# Patient Record
Sex: Female | Born: 1965 | Race: White | Hispanic: No | Marital: Married | Smoking: Never smoker
Health system: Southern US, Community
[De-identification: ages and names within clinical notes are randomized; demographics above are authoritative.]

## PROBLEM LIST (undated history)

## (undated) DIAGNOSIS — B019 Varicella without complication: Secondary | ICD-10-CM

## (undated) DIAGNOSIS — T7840XA Allergy, unspecified, initial encounter: Secondary | ICD-10-CM

## (undated) DIAGNOSIS — G43909 Migraine, unspecified, not intractable, without status migrainosus: Secondary | ICD-10-CM

## (undated) HISTORY — DX: Migraine, unspecified, not intractable, without status migrainosus: G43.909

## (undated) HISTORY — DX: Varicella without complication: B01.9

## (undated) HISTORY — DX: Allergy, unspecified, initial encounter: T78.40XA

---
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
## 2007-06-25 HISTORY — PX: BREAST SURGERY: SHX581

## 2007-08-27 LAB — HM PAP SMEAR: HM Pap smear: NORMAL

## 2007-09-23 ENCOUNTER — Ambulatory Visit: Payer: Self-pay | Admitting: Oncology

## 2007-09-25 ENCOUNTER — Ambulatory Visit: Payer: Self-pay | Admitting: General Surgery

## 2007-09-28 ENCOUNTER — Ambulatory Visit: Payer: Self-pay | Admitting: General Surgery

## 2007-09-28 HISTORY — PX: BREAST EXCISIONAL BIOPSY: SUR124

## 2007-10-14 ENCOUNTER — Ambulatory Visit: Payer: Self-pay | Admitting: Oncology

## 2007-10-23 ENCOUNTER — Ambulatory Visit: Payer: Self-pay | Admitting: Oncology

## 2008-03-24 ENCOUNTER — Ambulatory Visit: Payer: Self-pay | Admitting: Oncology

## 2008-04-21 ENCOUNTER — Ambulatory Visit: Payer: Self-pay | Admitting: Oncology

## 2008-04-24 ENCOUNTER — Ambulatory Visit: Payer: Self-pay | Admitting: Oncology

## 2008-09-05 ENCOUNTER — Ambulatory Visit: Payer: Self-pay | Admitting: Obstetrics and Gynecology

## 2009-09-25 ENCOUNTER — Ambulatory Visit: Payer: Self-pay | Admitting: Obstetrics and Gynecology

## 2010-08-29 LAB — HM PAP SMEAR: HM Pap smear: NORMAL

## 2010-10-02 ENCOUNTER — Ambulatory Visit: Payer: Self-pay | Admitting: Obstetrics and Gynecology

## 2011-10-17 ENCOUNTER — Ambulatory Visit: Payer: Self-pay | Admitting: Obstetrics and Gynecology

## 2011-10-17 LAB — HM MAMMOGRAPHY: HM Mammogram: NORMAL

## 2012-05-14 ENCOUNTER — Ambulatory Visit: Payer: Self-pay | Admitting: Family Medicine

## 2012-06-24 HISTORY — PX: BREAST BIOPSY: SHX20

## 2012-08-26 ENCOUNTER — Ambulatory Visit: Payer: Self-pay | Admitting: Internal Medicine

## 2012-08-26 ENCOUNTER — Encounter: Payer: Self-pay | Admitting: Internal Medicine

## 2012-08-26 ENCOUNTER — Ambulatory Visit (INDEPENDENT_AMBULATORY_CARE_PROVIDER_SITE_OTHER): Payer: BC Managed Care – PPO | Admitting: Internal Medicine

## 2012-08-26 VITALS — BP 110/72 | HR 66 | Temp 97.7°F | Resp 16 | Ht 67.5 in | Wt 175.0 lb

## 2012-08-26 DIAGNOSIS — IMO0002 Reserved for concepts with insufficient information to code with codable children: Secondary | ICD-10-CM

## 2012-08-26 DIAGNOSIS — Z789 Other specified health status: Secondary | ICD-10-CM

## 2012-08-26 DIAGNOSIS — M25519 Pain in unspecified shoulder: Secondary | ICD-10-CM | POA: Insufficient documentation

## 2012-08-26 DIAGNOSIS — M25512 Pain in left shoulder: Secondary | ICD-10-CM

## 2012-08-26 DIAGNOSIS — Z79899 Other long term (current) drug therapy: Secondary | ICD-10-CM

## 2012-08-26 DIAGNOSIS — M542 Cervicalgia: Secondary | ICD-10-CM

## 2012-08-26 NOTE — Patient Instructions (Addendum)
We are x raying your neck and shoulder to evaluate alignment and joint for signs of wear and tear  Depending on the results, we will screen for rheumatoid arthritis when yo return for fasting labs   Please sign a medical release form for Dr. Logan Bores' records   Your toe nail looks dystrophic vs early fungal infection  Try filing it daily from the top and applying tee tree oil daily after filing

## 2012-08-26 NOTE — Assessment & Plan Note (Signed)
Bilateral,  With moderate crepitus and grinding on exma.  Plain films.

## 2012-08-26 NOTE — Progress Notes (Signed)
Patient ID: Alice Spencer, female   DOB: [birth year], 47 y.o.   MRN: [id number]    Patient Active Problem List  Diagnosis  . Pain in joint, shoulder region  . Posterior neck pain    Subjective:  CC:   Chief Complaint  Patient presents with  . Establish Care    HPI:   Alice Spencer a 47 y.o. female who presents  Chronic neck pain and left shoulder pain with rotation. Started a few weeks after a History of minor injury last year at the gym when she fell backward.  Shoulder makes a grating nose when she rolls shoulders backward.  No radiation ,  No carpal tunnel syndrome.  Left thumb joint is also  painful. Left knee hurts if she flexes it during squats .  There is a family history of RA in his MGF .  She as history of scoliosis but was never treated since it was mild  (remembers screening in elementary school ). Played volley ball sr year high school.   Worked with a Psychologist, educational.  Has lost 20 lbs at [facility] but not exercising currently bc of daughter's tae kwan do.   Left breast surgery 2009 with sentinel lymph nodes biopsied and negtive. 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 mammogram April 2013 AT [facility].   Annual pelvic exams with Alice Spencer  Last PAP 2009? Mammogram orderdd by him      Past Medical History  Diagnosis Date  . Cancer     Left Breast  . Chicken pox   . Allergy   . Migraines     Past Surgical History  Procedure Laterality Date  . Breast surgery  2009       The following portions of the patient's history were reviewed and updated as appropriate: Allergies, current medications, and problem list.    Review of Systems:   12 Pt  review of systems was negative except those addressed in the HPI,     History   Social History  . Marital Status: Married    Spouse Name: N/A    Number of Children: N/A  . Years of Education: N/A   Occupational History  . Not on file.   Social History Main Topics  . Smoking status: Never Smoker   .  Smokeless tobacco: Never Used  . Alcohol Use: Yes  . Drug Use: No  . Sexually Active: Yes   Other Topics Concern  . Not on file   Social History Narrative             Objective:  BP 110/72  Pulse 66  Temp(Src) 97.7 F (36.5 C) (Oral)  Resp 16  Ht 5' 7.5" (1.715 m)  Wt 175 lb (79.379 kg)  BMI 26.99 kg/m2  SpO2 98%  LMP 08/22/2012  General appearance: alert, cooperative and appears stated age Ears: normal TM's and external ear canals both ears Throat: lips, mucosa, and tongue normal; teeth and gums normal Neck: no adenopathy, no carotid bruit, supple, symmetrical, trachea midline and thyroid not enlarged, symmetric, no tenderness/mass/nodules Back: symmetric, no curvature. ROM normal. No CVA tenderness. Lungs: clear to auscultation bilaterally Heart: regular rate and rhythm, S1, S2 normal, no murmur, click, rub or gallop Abdomen: soft, non-tender; bowel sounds normal; no masses,  no organomegaly Pulses: 2+ and symmetric Skin: Skin color, texture, turgor normal. No rashes or lesions Lymph nodes: Cervical, supraclavicular, and axillary nodes normal.  Assessment and Plan:  Pain in joint, shoulder region Bilateral,  With  moderate crepitus and grinding on exma.  Plain films.   Posterior neck pain Exam consistent with DJD vs OA . Marland Kitchen  No radiculopathy, some scoliosis on exam. Plain films ordered.    Updated Medication List No outpatient encounter prescriptions on file as of 08/26/2012.   No facility-administered encounter medications on file as of 08/26/2012.     Orders Placed This Encounter  Procedures  . DG Cervical Spine Complete  . DG Shoulder Left  . POCT urine pregnancy    No Follow-up on file.

## 2012-08-26 NOTE — Assessment & Plan Note (Signed)
Exam consistent with DJD vs OA . Marland Kitchen  No radiculopathy, some scoliosis on exam. Plain films ordered.

## 2012-08-27 ENCOUNTER — Telehealth: Payer: Self-pay | Admitting: Internal Medicine

## 2012-08-27 DIAGNOSIS — Z13220 Encounter for screening for lipoid disorders: Secondary | ICD-10-CM

## 2012-08-27 DIAGNOSIS — M130 Polyarthritis, unspecified: Secondary | ICD-10-CM

## 2012-08-27 DIAGNOSIS — R5381 Other malaise: Secondary | ICD-10-CM

## 2012-08-27 NOTE — Telephone Encounter (Signed)
Pt notified of results. Scheduled for next week would like Fasting labs and the blood draw for OA completed then if possible. Can you order these?

## 2012-08-27 NOTE — Telephone Encounter (Signed)
X rays of cervical spine and shoulder were normal.  No signs or joint erosions..  Her symptoms therefore are likely plain old OA, not rheumatoid,  But I will add the blood tests to her lab draw just to be sure.

## 2012-08-27 NOTE — Telephone Encounter (Signed)
already done per note

## 2012-09-04 ENCOUNTER — Other Ambulatory Visit (INDEPENDENT_AMBULATORY_CARE_PROVIDER_SITE_OTHER): Payer: BC Managed Care – PPO

## 2012-09-04 DIAGNOSIS — Z13220 Encounter for screening for lipoid disorders: Secondary | ICD-10-CM

## 2012-09-04 DIAGNOSIS — R5381 Other malaise: Secondary | ICD-10-CM

## 2012-09-04 DIAGNOSIS — M130 Polyarthritis, unspecified: Secondary | ICD-10-CM

## 2012-09-04 LAB — CBC WITH DIFFERENTIAL/PLATELET
Basophils Relative: 0.8 % (ref 0.0–3.0)
Eosinophils Absolute: 0.1 10*3/uL (ref 0.0–0.7)
Eosinophils Relative: 1.5 % (ref 0.0–5.0)
Lymphocytes Relative: 25.6 % (ref 12.0–46.0)
Neutrophils Relative %: 66.1 % (ref 43.0–77.0)
RBC: 3.84 Mil/uL — ABNORMAL LOW (ref 3.87–5.11)
WBC: 4.5 10*3/uL (ref 4.5–10.5)

## 2012-09-04 LAB — SEDIMENTATION RATE: Sed Rate: 11 mm/hr (ref 0–22)

## 2012-09-04 LAB — COMPREHENSIVE METABOLIC PANEL
AST: 20 U/L (ref 0–37)
Albumin: 4.1 g/dL (ref 3.5–5.2)
BUN: 18 mg/dL (ref 6–23)
Calcium: 9.1 mg/dL (ref 8.4–10.5)
Chloride: 109 mEq/L (ref 96–112)
Glucose, Bld: 94 mg/dL (ref 70–99)
Potassium: 4.3 mEq/L (ref 3.5–5.1)

## 2012-09-04 LAB — LIPID PANEL
HDL: 51.3 mg/dL (ref >39.00)
LDL Cholesterol: 118 mg/dL — ABNORMAL HIGH (ref 0–99)
Total CHOL/HDL Ratio: 3

## 2012-09-04 LAB — TSH: TSH: 1.8 u[IU]/mL (ref 0.35–5.50)

## 2012-09-07 LAB — ANA: Anti Nuclear Antibody(ANA): NEGATIVE

## 2012-09-28 ENCOUNTER — Ambulatory Visit (INDEPENDENT_AMBULATORY_CARE_PROVIDER_SITE_OTHER): Payer: BC Managed Care – PPO | Admitting: Internal Medicine

## 2012-09-28 ENCOUNTER — Encounter: Payer: Self-pay | Admitting: Internal Medicine

## 2012-09-28 VITALS — BP 108/68 | HR 76 | Temp 98.5°F | Wt 174.8 lb

## 2012-09-28 DIAGNOSIS — Z1239 Encounter for other screening for malignant neoplasm of breast: Secondary | ICD-10-CM

## 2012-09-28 DIAGNOSIS — Z0000 Encounter for general adult medical examination without abnormal findings: Secondary | ICD-10-CM

## 2012-09-28 NOTE — Progress Notes (Signed)
Patient ID: Alice Spencer, female   DOB: [birth year], 47 y.o.   MRN: [id number]   Subjective:     Alice Spencer is a 47 y.o. female here for a routine exam.  Current complaints:  Occasional left knee suprapatellar pain, chronic, evaluated by Ortho last year and PT referral made for s/p tendonitis but patient never went. Skin tag on right inner medial thigh near vagina, on her panty lin so irritating her. . Normal PAPs annually last one 2012. Periods are becoming shorter and heavier. No hot flashes yet.   Personal health questionnaire reviewed: yes.   Gynecologic History Patient's last menstrual period was 09/18/2012. Contraception: none Last Pap: 2012. Results were: normal Last mammogram: 2013. Results were: normal  Obstetric History OB History   Grav Para Term Preterm Abortions TAB SAB Ect Mult Living                   The following portions of the patient's history were reviewed and updated as appropriate: allergies, current medications, past family history, past medical history, past social history, past surgical history and problem list.  Review of Systems A comprehensive review of systems was negative.    Objective:      General Appearance:    Alert, cooperative, no distress, appears stated age  Head:    Normocephalic, without obvious abnormality, atraumatic  Eyes:    PERRL, conjunctiva/corneas clear, EOM's intact, fundi    benign, both eyes  Ears:    Normal TM's and external ear canals, both ears  Nose:   Nares normal, septum midline, mucosa normal, no drainage    or sinus tenderness  Throat:   Lips, mucosa, and tongue normal; teeth and gums normal  Neck:   Supple, symmetrical, trachea midline, no adenopathy;    thyroid:  no enlargement/tenderness/nodules; no carotid   bruit or JVD  Back:     Symmetric, no curvature, ROM normal, no CVA tenderness  Lungs:     Clear to auscultation bilaterally, respirations unlabored  Chest Wall:    No tenderness or deformity   Heart:     Regular rate and rhythm, S1 and S2 normal, no murmur, rub   or gallop  Breast Exam:    No tenderness, masses, or nipple abnormality  Abdomen:     Soft, non-tender, bowel sounds active all four quadrants,    no masses, no organomegaly  Genitalia:    Pelvic: cervix normal in appearance, external genitalia normal, no adnexal masses or tenderness, no cervical motion tenderness, rectovaginal septum normal, uterus normal size, shape, and consistency and vagina normal without discharge  Extremities:   Extremities normal, atraumatic, no cyanosis or edema  Pulses:   2+ and symmetric all extremities  Skin:   Skin color, texture, turgor normal, no rashes or lesions  Lymph nodes:   Cervical, supraclavicular, and axillary nodes normal  Neurologic:   CNII-XII intact, normal strength, sensation and reflexes    throughout       Assessment:    Healthy female exam.    Plan:    Follow up in: 1 year.

## 2012-09-30 ENCOUNTER — Encounter: Payer: Self-pay | Admitting: Internal Medicine

## 2012-10-01 ENCOUNTER — Encounter: Payer: Self-pay | Admitting: Internal Medicine

## 2012-10-28 ENCOUNTER — Ambulatory Visit: Payer: Self-pay | Admitting: Internal Medicine

## 2012-10-30 ENCOUNTER — Telehealth: Payer: Self-pay | Admitting: Internal Medicine

## 2012-10-30 ENCOUNTER — Ambulatory Visit: Payer: Self-pay | Admitting: Internal Medicine

## 2012-10-30 NOTE — Telephone Encounter (Signed)
radiology needs additional views of the left breast on recent mammogram.  . They should be calling her

## 2012-11-03 ENCOUNTER — Telehealth: Payer: Self-pay | Admitting: Internal Medicine

## 2012-11-03 NOTE — Telephone Encounter (Signed)
I have spoken with patient by phone, and recommended Dr. Lemar Livings.  I told patient amber would call her with the date time and address.

## 2012-11-03 NOTE — Telephone Encounter (Signed)
Alice Spencer @ [facility] called stating the patients additional views showed Bi Rads 4. I have scheduled her with [facility] on 5/27 @ 945. The patient is not aware at this point, I was waiting for someone to contact her.

## 2012-11-03 NOTE — Telephone Encounter (Signed)
Please advise 

## 2012-11-05 ENCOUNTER — Telehealth: Payer: Self-pay | Admitting: *Deleted

## 2012-11-05 NOTE — Telephone Encounter (Signed)
Talked with patient came by at lunch today and picked up appointment.

## 2012-11-05 NOTE — Telephone Encounter (Signed)
Patient called stating had extra mammogram images done on 5/9 would like to know the next step. Called patient left message to return call to assure her that as soon as results are available we will call.

## 2012-11-17 ENCOUNTER — Ambulatory Visit: Payer: Self-pay | Admitting: General Surgery

## 2012-11-23 ENCOUNTER — Ambulatory Visit: Payer: Self-pay | Admitting: General Surgery

## 2012-11-25 ENCOUNTER — Ambulatory Visit (INDEPENDENT_AMBULATORY_CARE_PROVIDER_SITE_OTHER): Payer: BC Managed Care – PPO | Admitting: General Surgery

## 2012-11-25 ENCOUNTER — Encounter: Payer: Self-pay | Admitting: General Surgery

## 2012-11-25 VITALS — BP 138/82 | HR 76 | Resp 12 | Ht 67.0 in | Wt 183.0 lb

## 2012-11-25 DIAGNOSIS — R920 Mammographic microcalcification found on diagnostic imaging of breast: Secondary | ICD-10-CM

## 2012-11-25 NOTE — Progress Notes (Signed)
Patient ID: Alice Spencer, female   DOB: 1966-02-10, 47 y.o.   MRN: [id number]  Chief Complaint  Patient presents with  . Breast Problem    HPI Alice Spencer is a 47 y.o. female. Patient here today referred by D.r Darrick Huntsman for breast evaluation. She had an abnormal mammogram on 10-28-12. She has "not felt anything". Denies breast trauma or injury.  Denies family history of breast cancer. Known history of benign phylloid tumor left breast  SN negative and margins were clear in April 2009 by Dr Okey Dupre. HPI  Past Medical History  Diagnosis Date  . Chicken pox   . Allergy   . Migraines     Past Surgical History  Procedure Laterality Date  . Breast surgery  2009    benign phylloid tumor    Family History  Problem Relation Age of Onset  . Arthritis Father   . Diabetes Father   . Cancer Maternal Uncle     colon  . Cancer Paternal Aunt     lung  . Arthritis Maternal Grandfather   . Cancer Maternal Grandfather     colon  . Cancer Paternal Grandfather     carcinoma of the skin  . Cancer Maternal Uncle     colon    Social History History  Substance Use Topics  . Smoking status: Never Smoker   . Smokeless tobacco: Never Used  . Alcohol Use: Yes     Comment: occasionally    Allergies  Allergen Reactions  . Penicillins Rash    Current Outpatient Prescriptions  Medication Sig Dispense Refill  . ibuprofen (ADVIL,MOTRIN) 600 MG tablet Take 1 tablet by mouth as needed.       No current facility-administered medications for this visit.    Review of Systems Review of Systems  Constitutional: Negative.   Respiratory: Negative.   Cardiovascular: Negative.     Blood pressure 138/82, pulse 76, resp. rate 12, height 5\' 7"  (1.702 m), weight 183 lb (83.008 kg), last menstrual period 11/12/2012.  Physical Exam Physical Exam  Constitutional: She is oriented to person, place, and time. She appears well-developed and well-nourished.  Cardiovascular: Normal rate and regular rhythm.    Pulmonary/Chest: Effort normal and breath sounds normal. Right breast exhibits no inverted nipple, no mass, no nipple discharge, no skin change and no tenderness. Left breast exhibits no inverted nipple, no mass, no nipple discharge, no skin change and no tenderness.  Lymphadenopathy:    She has no cervical adenopathy.    She has no axillary adenopathy.  Neurological: She is alert and oriented to person, place, and time.  Skin: Skin is warm and dry.    Data Reviewed Pathology from the 09/28/2007 left breast excision and sentinel node biopsy showed a benign phylloid ease tumor measuring 1.6 cm in diameter. Atypical epithelial hyperplasia was limited to the phyllodes tumor itself. Benign lymph nodes were identified. The overlying skin was unremarkable.  Screening mammograms dated 10/29/2012 showed heterogeneously dense breast tissue. A small focal asymmetry in the superior lateral left breast was unchanged from studies dating back to 2011. A new cluster of microcalcifications were identified in the anterior depth. The right breast is unremarkable. BI-RAD-0.  Focal spot compression views of the left breast dated 10/30/2012 showed a small cluster of calcifications in the inferior superior lateral left breast. Many around a few are curvilinear and a few amorphous. Additional round calcifications are noted in the retroareolar area as well. BI-RAD-4  Assessment    New microcalcifications of  the left breast.    Plan    Options for management were reviewed: 1) careful six-month followup exam for progression versus 2) early stereotactic biopsy.  The pros and cons of each were reviewed. 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 There was no risk of upstaging disease or missing an opportunity for breast conservation with a followup mammogram. The patient is somewhat concerned about change in her mammogram over the last year and desired  to proceed biopsy. The risks and benefits of biopsy were reviewed in detail. Bleeding and infection, post procedure pain and discomfort during the procedure were discussed.  The procedure is tentatively scheduled for 12/07/2012.       Earline Mayotte 11/26/2012, 2:34 PM

## 2012-11-25 NOTE — Patient Instructions (Addendum)
Monitor and repeat mammogram in 6 months or proceed with biopsy.  The stereotactic procedure was reviewed with the patient. The potential for bleeding, infection and pain was reviewed. At this time, the benefits outweigh the risk, and the patient is amenable to proceed.  Patient has been scheduled for left stereotactic breast biopsy on 12/07/2012 at 1:00 at [facility].  Patient has been informed of instructions.

## 2012-11-26 ENCOUNTER — Encounter: Payer: Self-pay | Admitting: General Surgery

## 2012-11-27 ENCOUNTER — Encounter: Payer: Self-pay | Admitting: Internal Medicine

## 2012-12-07 ENCOUNTER — Ambulatory Visit: Payer: Self-pay | Admitting: General Surgery

## 2012-12-07 ENCOUNTER — Encounter: Payer: Self-pay | Admitting: Internal Medicine

## 2012-12-07 DIAGNOSIS — R920 Mammographic microcalcification found on diagnostic imaging of breast: Secondary | ICD-10-CM

## 2012-12-08 ENCOUNTER — Telehealth: Payer: Self-pay | Admitting: *Deleted

## 2012-12-08 ENCOUNTER — Telehealth: Payer: Self-pay | Admitting: General Surgery

## 2012-12-08 LAB — PATHOLOGY REPORT

## 2012-12-08 NOTE — Telephone Encounter (Signed)
Patient called stating you were going to call her this afternoon with biopsy results. This patient has asked that you call her cell number, [phone].

## 2012-12-08 NOTE — Telephone Encounter (Signed)
Patient notified that the pathology on yesterday's left breast biopsy was benign. Reports she is doing well. She will follow up with the nurse next week for a wound check as scheduled, and will have a left mammogram arranged through this office in six months.

## 2012-12-10 ENCOUNTER — Encounter: Payer: Self-pay | Admitting: General Surgery

## 2012-12-15 ENCOUNTER — Ambulatory Visit (INDEPENDENT_AMBULATORY_CARE_PROVIDER_SITE_OTHER): Payer: BC Managed Care – PPO | Admitting: *Deleted

## 2012-12-15 ENCOUNTER — Ambulatory Visit: Payer: Self-pay | Admitting: General Surgery

## 2012-12-15 DIAGNOSIS — R920 Mammographic microcalcification found on diagnostic imaging of breast: Secondary | ICD-10-CM

## 2012-12-15 NOTE — Progress Notes (Signed)
Patient ID: Alice Spencer, female   DOB: 1965/08/15, 47 y.o.   MRN: [id number] Patient here today for follow up post breast left biopsy. .  Minimal bruising noted.  The patient is aware that a heating pad may be used for comfort as needed.  Aware of pathology. Follow up as scheduled.

## 2013-04-30 ENCOUNTER — Encounter: Payer: Self-pay | Admitting: General Surgery

## 2013-04-30 ENCOUNTER — Ambulatory Visit: Payer: Self-pay | Admitting: General Surgery

## 2013-05-10 ENCOUNTER — Encounter: Payer: Self-pay | Admitting: General Surgery

## 2013-05-10 ENCOUNTER — Ambulatory Visit (INDEPENDENT_AMBULATORY_CARE_PROVIDER_SITE_OTHER): Payer: BC Managed Care – PPO | Admitting: General Surgery

## 2013-05-10 VITALS — BP 132/68 | HR 84 | Resp 14 | Ht 68.0 in | Wt 198.0 lb

## 2013-05-10 DIAGNOSIS — N6002 Solitary cyst of left breast: Secondary | ICD-10-CM

## 2013-05-10 DIAGNOSIS — N6009 Solitary cyst of unspecified breast: Secondary | ICD-10-CM

## 2013-05-10 DIAGNOSIS — R920 Mammographic microcalcification found on diagnostic imaging of breast: Secondary | ICD-10-CM

## 2013-05-10 NOTE — Patient Instructions (Signed)
Patient to return as needed. 

## 2013-05-10 NOTE — Progress Notes (Signed)
Patient ID: Alice Spencer, female   DOB: [birth year], 47 y.o.   MRN: [id number]  Chief Complaint  Patient presents with  . Follow-up    mammogram    HPI Alice Spencer is a 47 y.o. female who presents for a breast evaluation. The most recent mammogram was done on 04/30/13. Patient does perform regular self breast checks and gets regular mammograms done.   The patient reports no difficulties with her breast.  HPI  Past Medical History  Diagnosis Date  . Chicken pox   . Allergy   . Migraines     Past Surgical History  Procedure Laterality Date  . Breast surgery  2009    benign phylloid tumor    Family History  Problem Relation Age of Onset  . Arthritis Father   . Diabetes Father   . Cancer Maternal Uncle     colon  . Cancer Paternal Aunt     lung  . Arthritis Maternal Grandfather   . Cancer Maternal Grandfather     colon  . Cancer Paternal Grandfather     carcinoma of the skin  . Cancer Maternal Uncle     colon    Social History History  Substance Use Topics  . Smoking status: Never Smoker   . Smokeless tobacco: Never Used  . Alcohol Use: Yes     Comment: occasionally    Allergies  Allergen Reactions  . Penicillins Rash    Current Outpatient Prescriptions  Medication Sig Dispense Refill  . ibuprofen (ADVIL,MOTRIN) 600 MG tablet Take 1 tablet by mouth as needed.       No current facility-administered medications for this visit.    Review of Systems Review of Systems  Constitutional: Negative.   Respiratory: Negative.     Blood pressure 132/68, pulse 84, resp. rate 14, height 5\' 8"  (1.727 m), weight 198 lb (89.812 kg), last menstrual period 04/19/2013.  Physical Exam Physical Exam  Constitutional: She is oriented to person, place, and time. She appears well-developed and well-nourished.  Eyes: No scleral icterus.  Cardiovascular: Normal rate, regular rhythm and normal heart sounds.   Pulmonary/Chest: Breath sounds normal. Right breast exhibits no  inverted nipple, no mass, no nipple discharge, no skin change and no tenderness. Left breast exhibits no inverted nipple, no mass, no nipple discharge, no skin change and no tenderness.  Abdominal: Soft. Bowel sounds are normal.  Lymphadenopathy:    She has no cervical adenopathy.    She has no axillary adenopathy.  Neurological: She is alert and oriented to person, place, and time.  Skin: Skin is warm and dry.    Data Reviewed Left breast mammogram dated April 30, 2013 was reviewed. Postbiopsy changes.BI-RAD-1.    Assessment    Benign breast exam.     Plan    The patient should resume annual screening mammograms in spring 2015. The patient was encouraged to call the office if she experiences any changes in her breasts. Follow up otherwise will be on an as-needed basis.        Alice Spencer 05/10/2013, 9:44 PM

## 2014-02-22 ENCOUNTER — Encounter: Payer: Self-pay | Admitting: Podiatry

## 2014-02-22 ENCOUNTER — Ambulatory Visit (INDEPENDENT_AMBULATORY_CARE_PROVIDER_SITE_OTHER): Payer: BC Managed Care – PPO

## 2014-02-22 ENCOUNTER — Ambulatory Visit (INDEPENDENT_AMBULATORY_CARE_PROVIDER_SITE_OTHER): Payer: BC Managed Care – PPO | Admitting: Podiatry

## 2014-02-22 VITALS — BP 95/63 | HR 70 | Resp 16 | Ht 68.0 in | Wt 153.0 lb

## 2014-02-22 DIAGNOSIS — M8430XA Stress fracture, unspecified site, initial encounter for fracture: Secondary | ICD-10-CM

## 2014-02-22 DIAGNOSIS — M84371A Stress fracture, right ankle, initial encounter for fracture: Secondary | ICD-10-CM

## 2014-02-22 DIAGNOSIS — M779 Enthesopathy, unspecified: Secondary | ICD-10-CM

## 2014-02-23 NOTE — Progress Notes (Signed)
Subjective:     Patient ID: Alice Spencer, female   DOB: 29-Apr-1966, 48 y.o.   MRN: [id number]  HPI 48 year old female presents the office today with complaints of right medial ankle pain. She states that on January 30, 2014 while at church camp she had a table will over her ankle resulting in an injury. After the injury she notes her ankle became severely bruised on both the medial and lateral aspects and pain with ambulation. States that she went to the office in which x-rays were obtained and she was told she had no fracture. Since the injury she continues to have pain with some bruising and swelling to the ankle. She states she had an abrasion to the medial ankle at the time of the accident which is currently scabbed over. Discomfort with ambulation and pressure. No other complaints. Review of Systems  Musculoskeletal:       Right medial ankle pain  All other systems reviewed and are negative.      Objective:   Physical Exam AAO x3, NAD DP/PT pulses palpable 2/4 b/l. CRT < 3sec Protective sensation intact with Derrel Nip monofilament, vibratory sensation intact. Pain on palpation to the right medial malleolus as well as pain with vibration over the area. She has mild discomfort over the flexor tendons. Mild discomfort with flexion-extension of the hallux along the medial ankle. Mild discomfort with plantarflexion, inversion although MMT 5/5. ROM WNL. No pain along the lateral ankle ligaments or fibula, syndesmosis, proximal tib-fib, foot. No pain with lateral compression of the calcaneus.No pain/deformity to the left lower extremity.  Ecchymosis over the lateral aspect of the heel and medial ankle.  Healed abrasion over the medial aspect of the ankle without any surrounding erythema, drainage, or other clinical signs of infection.     Assessment:     48 year old female possible stress fracture medial malleolus, flexor tendinitis.    Plan:     -X-rays were obtained and reviewed with  the patient. -Various treatment options were discussed with patient including alternatives, risks, complications. -Due to the pain directly over the medial malleolus and pain with vibration, will treat as a stress fracture for now with immobilization in CAM boot (informed her she cannot drive with the boot). Will re x-ray in 2 weeks to determine if there are any osseous changes.  -Pain along the course of the flexor tendons could be a result of tendonitis due to the injury. The tendons do appear to be intact at this time. -Follow up in 2 weeks, or sooner if any problems arise or if there are any change in symptoms.  -At 2 weeks, if there continues to be pain/swelling and negative x-ray, will likely obtain an MRI.  -Call with any questions or concerns, change in symptoms.

## 2014-03-08 ENCOUNTER — Ambulatory Visit (INDEPENDENT_AMBULATORY_CARE_PROVIDER_SITE_OTHER): Payer: BC Managed Care – PPO

## 2014-03-08 ENCOUNTER — Ambulatory Visit (INDEPENDENT_AMBULATORY_CARE_PROVIDER_SITE_OTHER): Payer: BC Managed Care – PPO | Admitting: Podiatry

## 2014-03-08 VITALS — BP 115/65 | HR 63 | Resp 16

## 2014-03-08 DIAGNOSIS — T148XXA Other injury of unspecified body region, initial encounter: Secondary | ICD-10-CM

## 2014-03-08 DIAGNOSIS — M779 Enthesopathy, unspecified: Secondary | ICD-10-CM

## 2014-03-08 NOTE — Progress Notes (Signed)
Patient ID: AUDREENA SACHDEVA, female   DOB: Feb 15, 1966, 48 y.o.   MRN: [id number]  Subjective: Ms. O'Neil returns the office they for followup evaluation of right medial ankle pain. She states that she was wearing the CAM boot for some time after her last appointment but she since has discontinued. She states that her pain has decreased as well as the swelling/bruising. She continues have slight numbness over the medial ankle over the site where the table fell on her. Denies any pain with ambulation.  She presents today wearing a sandal. No other complaints.  Objective: AAO x3, NAD DP/PT pulses palpable 2/4 b/l. CRT< 3sec Protective sensation intact with Derrel Nip monofilament, vibratory sensation intact, Achilles tendon reflex intact. Mild numbness over medial ankle over the site of injury. Sensation intact over the medial aspect of the foot. Mild discomfort over the medial aspect of the ankle overlying the medial malleolus. Minimal overlying edema and has decreased his last appointment. Ecchymosis over the ankle/heel has resolved. Small healed abrasion over the medial aspect of the ankle but no surrounding erythema, drainage or other clinical signs of infection. Mild discomfort upon plantar flexion of the hallux along the medial ankle. No pain with ankle active or passive range of motion. MMT 5/5, ROM WNL No leg pain, swelling, warmth.  Assessment: 48 year old female left medial malleolus or contusion, flexor tendinitis.  Plan: -Various treatment options were discussed including alternatives, risks, complications. -X-rays were obtained and reviewed with the patient. No evidence of fracture or stress fracture. -Recommend continuing ice to the effected area. -She is currently not having any difficulty with ambulation can continue to wear a regular shoe. If she needs some stability recommended an ankle brace or return to the boot if needed. -Anti-inflammatories as needed. -Continue with  compression anklet to help decrease edema. -Followup as needed. Call with any questions or concerns or change in symptoms.

## 2014-03-23 ENCOUNTER — Ambulatory Visit: Payer: BC Managed Care – PPO | Admitting: Internal Medicine

## 2014-04-25 ENCOUNTER — Encounter: Payer: Self-pay | Admitting: Podiatry

## 2014-04-26 ENCOUNTER — Ambulatory Visit (INDEPENDENT_AMBULATORY_CARE_PROVIDER_SITE_OTHER): Payer: BC Managed Care – PPO | Admitting: Internal Medicine

## 2014-04-26 ENCOUNTER — Other Ambulatory Visit (HOSPITAL_COMMUNITY)
Admission: RE | Admit: 2014-04-26 | Discharge: 2014-04-26 | Disposition: A | Discharge status: Home / Self Care | Payer: BC Managed Care – PPO | Source: Ambulatory Visit | Attending: Internal Medicine | Admitting: Internal Medicine

## 2014-04-26 ENCOUNTER — Encounter: Payer: Self-pay | Admitting: Internal Medicine

## 2014-04-26 VITALS — BP 112/72 | HR 78 | Temp 98.6°F | Resp 16 | Ht 68.0 in | Wt 170.5 lb

## 2014-04-26 DIAGNOSIS — Z0000 Encounter for general adult medical examination without abnormal findings: Secondary | ICD-10-CM

## 2014-04-26 DIAGNOSIS — Z1151 Encounter for screening for human papillomavirus (HPV): Secondary | ICD-10-CM | POA: Diagnosis present

## 2014-04-26 DIAGNOSIS — Z23 Encounter for immunization: Secondary | ICD-10-CM

## 2014-04-26 DIAGNOSIS — Z1239 Encounter for other screening for malignant neoplasm of breast: Secondary | ICD-10-CM

## 2014-04-26 DIAGNOSIS — Z124 Encounter for screening for malignant neoplasm of cervix: Secondary | ICD-10-CM

## 2014-04-26 DIAGNOSIS — R634 Abnormal weight loss: Secondary | ICD-10-CM

## 2014-04-26 DIAGNOSIS — Z01419 Encounter for gynecological examination (general) (routine) without abnormal findings: Secondary | ICD-10-CM | POA: Diagnosis not present

## 2014-04-26 DIAGNOSIS — E559 Vitamin D deficiency, unspecified: Secondary | ICD-10-CM

## 2014-04-26 DIAGNOSIS — R920 Mammographic microcalcification found on diagnostic imaging of breast: Secondary | ICD-10-CM

## 2014-04-26 LAB — CBC WITH DIFFERENTIAL/PLATELET
BASOS PCT: 0.8 % (ref 0.0–3.0)
Basophils Absolute: 0.1 10*3/uL (ref 0.0–0.1)
Eosinophils Absolute: 0.1 10*3/uL (ref 0.0–0.7)
Eosinophils Relative: 2.1 % (ref 0.0–5.0)
HEMATOCRIT: 39.8 % (ref 36.0–46.0)
HEMOGLOBIN: 13 g/dL (ref 12.0–15.0)
LYMPHS PCT: 26 % (ref 12.0–46.0)
Lymphs Abs: 1.8 10*3/uL (ref 0.7–4.0)
MCHC: 32.7 g/dL (ref 30.0–36.0)
MCV: 94.1 fl (ref 78.0–100.0)
MONOS PCT: 4.5 % (ref 3.0–12.0)
Monocytes Absolute: 0.3 10*3/uL (ref 0.1–1.0)
NEUTROS ABS: 4.6 10*3/uL (ref 1.4–7.7)
Neutrophils Relative %: 66.6 % (ref 43.0–77.0)
Platelets: 303 10*3/uL (ref 150.0–400.0)
RBC: 4.23 Mil/uL (ref 3.87–5.11)
RDW: 12.1 % (ref 11.5–15.5)
WBC: 6.9 10*3/uL (ref 4.0–10.5)

## 2014-04-26 LAB — COMPREHENSIVE METABOLIC PANEL
ALBUMIN: 3.8 g/dL (ref 3.5–5.2)
ALK PHOS: 73 U/L (ref 39–117)
ALT: 21 U/L (ref 0–35)
AST: 22 U/L (ref 0–37)
BUN: 18 mg/dL (ref 6–23)
CO2: 33 mEq/L — ABNORMAL HIGH (ref 19–32)
Calcium: 9.4 mg/dL (ref 8.4–10.5)
Chloride: 99 mEq/L (ref 96–112)
Creatinine, Ser: 1.2 mg/dL (ref 0.4–1.2)
GFR: 51.84 mL/min — ABNORMAL LOW (ref >60.00)
Glucose, Bld: 93 mg/dL (ref 70–99)
POTASSIUM: 3.9 meq/L (ref 3.5–5.1)
SODIUM: 137 meq/L (ref 135–145)
TOTAL PROTEIN: 7.2 g/dL (ref 6.0–8.3)
Total Bilirubin: 0.9 mg/dL (ref 0.2–1.2)

## 2014-04-26 NOTE — Progress Notes (Signed)
Patient ID: Alice Spencer, female   DOB: [birth year], 48 y.o.   MRN: [id number]     Subjective:     Alice Spencer is a 48 y.o. female and is here for a comprehensive physical exam. The patient reports no problems.  She has intentionally lost weight .  Started at 193 lbs in January, nadir was 155 lbs a few weeks ago.   Using herbal life and working out with a trainer 3/week; still drinking one shake daily.    History   Social History  . Marital Status: Married    Spouse Name: N/A    Number of Children: N/A  . Years of Education: N/A   Occupational History  . Not on file.   Social History Main Topics  . Smoking status: Never Smoker   . Smokeless tobacco: Never Used  . Alcohol Use: 1.2 oz/week    1 Glasses of wine, 1 Shots of liquor per week     Comment: occasionally  . Drug Use: No  . Sexual Activity: Yes   Other Topics Concern  . Not on file   Social History Narrative            Health Maintenance  Topic Date Due  . PAP SMEAR  09/04/2013  . INFLUENZA VACCINE  01/23/2015  . TETANUS/TDAP  04/26/2024    The following portions of the patient's history were reviewed and updated as appropriate: allergies, current medications, past family history, past medical history, past social history, past surgical history and problem list.  Review of Systems A comprehensive review of systems was negative.   Objective:   BP 112/72 mmHg  Pulse 78  Temp(Src) 98.6 F (37 C) (Oral)  Resp 16  Ht 5\' 8"  (1.727 m)  Wt 170 lb 8 oz (77.338 kg)  BMI 25.93 kg/m2  SpO2 100%  LMP 04/16/2014 (Exact Date)  General Appearance:    Alert, cooperative, no distress, appears stated age  Head:    Normocephalic, without obvious abnormality, atraumatic  Eyes:    PERRL, conjunctiva/corneas clear, EOM's intact, fundi    benign, both eyes  Ears:    Normal TM's and external ear canals, both ears  Nose:   Nares normal, septum midline, mucosa normal, no drainage    or sinus tenderness  Throat:   Lips,  mucosa, and tongue normal; teeth and gums normal  Neck:   Supple, symmetrical, trachea midline, no adenopathy;    thyroid:  no enlargement/tenderness/nodules; no carotid   bruit or JVD  Back:     Symmetric, no curvature, ROM normal, no CVA tenderness  Lungs:     Clear to auscultation bilaterally, respirations unlabored  Chest Wall:    No tenderness or deformity   Heart:    Regular rate and rhythm, S1 and S2 normal, no murmur, rub   or gallop  Breast Exam:    No tenderness, masses, or nipple abnormality  Abdomen:     Soft, non-tender, bowel sounds active all four quadrants,    no masses, no organomegaly  Genitalia:    Pelvic: cervix normal in appearance, external genitalia normal, no adnexal masses or tenderness, no cervical motion tenderness, rectovaginal septum normal, uterus normal size, shape, and consistency and vagina normal without discharge  Extremities:   Extremities normal, atraumatic, no cyanosis or edema  Pulses:   2+ and symmetric all extremities  Skin:   Skin color, texture, turgor normal, no rashes or lesions  Lymph nodes:   Cervical, supraclavicular, and axillary nodes normal  Neurologic:   CNII-XII intact, normal strength, sensation and reflexes    throughout     Assessment and Plan:   Breast microcalcification, mammographic S/p breast biopsy in Sept 2014,  Benign.  Resume annual mammograms .  Breast exam today was normal.   Routine general medical examination at a health care facility Annualwellness  exam was done as well as a comprehensive physical exam and management of acute and chronic conditions .  During the course of the visit the patient was educated and counseled about appropriate screening and preventive services including : fall prevention , diabetes screening, nutrition counseling, colorectal cancer screening, and recommended immunizations.  Printed recommendations for health maintenance screenings was given.   Loss of weight Intentional,  from an obese BMI  at 198 lbs , down  To 153 lbs initially. with some regain of weight  since altering her diet. I have congratulated her in reduction of   BMI and encouraged  Continued weight management usin g  a low glycemic index diet and regular exercise a minimum of 5 days per week.     Updated Medication List Outpatient Encounter Prescriptions as of 04/26/2014  Medication Sig  . ibuprofen (ADVIL,MOTRIN) 600 MG tablet Take 1 tablet by mouth as needed.

## 2014-04-26 NOTE — Progress Notes (Signed)
Pre-visit discussion using our clinic review tool. No additional management support is needed unless otherwise documented below in the visit note.  

## 2014-04-26 NOTE — Assessment & Plan Note (Addendum)
S/p breast biopsy in Sept 2014,  Benign.  Resume annual mammograms .  Breast exam today was normal.

## 2014-04-26 NOTE — Patient Instructions (Addendum)
You had your annual  wellness exam today.  We will repeat your PAP smear in 2018, sooner if needed   We will schedule your mammogram at [facility] in December at [place] received the TDaP vaccine today.  If you develop a sore arm , use tylenol and ibuprofen for a few days   We will contact you with the bloodwork results  This is  my version of a  "Low GI"  Diet:  It will help you maintain your weight  All of the foods can be found at grocery stores and in bulk at [employer].  The Atkins protein bars and shakes are available in more varieties at [employer], [employer] and [place].     7 AM Breakfast:  Choose from the following:  Low carbohydrate Protein  Shakes (I recommend the EAS AdvantEdge "Carb Control" shakes  Or the low carb shakes by Atkins.    2.5 carbs   Arnold's "Sandwhich Thin"toasted  w/ peanut butter (no jelly: about 20 net carbs  "Bagel Thin" with cream cheese and salmon: about 20 carbs   a scrambled egg/bacon/cheese burrito made with [place]'s "carb balance" whole wheat tortilla  (about 10 net carbs )   Avoid cereal and bananas, oatmeal and cream of wheat and grits. They are loaded with carbohydrates!   10 AM: high protein snack  Protein bar by Atkins (the snack size, under 200 cal, usually < 6 net carbs).    A stick of cheese:  Around 1 carb,  100 cal     Dannon Light n Fit [place] Yogurt  (80 cal, 8 carbs)  Other so called "protein bars" and Greek yogurts tend to be loaded with carbohydrates.  Remember, in food advertising, the word "energy" is synonymous for " carbohydrate."  Lunch:   A Sandwich using the bread choices listed, Can use any  Eggs,  lunchmeat, grilled meat or canned tuna), avocado, regular mayo/mustard  and cheese.  A Salad using blue cheese, ranch,  Goddess or vinagrette,  No croutons or "confetti" and no "candied nuts" but regular nuts OK.   No pretzels or chips.  Pickles and miniature sweet peppers are a good low carb alternative  that provide a "crunch"  The bread is the only source of carbohydrate in a sandwich and  can be decreased by trying some of these alternatives to traditional loaf bread  Joseph's makes a pita bread and a flat bread that are 50 cal and 4 net carbs available at [place] and [place].  This can be toasted to use with hummous as well  Toufayan makes a low carb flatbread that's 100 cal and 9 net carbs available at [employer] and [employer]'s makes 2 sizes of  Low carb whole wheat tortilla  (The large one is 210 cal and 6 net carbs) Avoid "Low fat dressings, as well as Barry Brunner and [place] dressings They are loaded with sugar!   3 PM/ Mid day  Snack:  Consider  1 ounce of  almonds, walnuts, pistachios, pecans, peanuts,  Macadamia nuts or a nut medley.  Avoid "granola"; the dried cranberries and raisins are loaded with carbohydrates. Mixed nuts as long as there are no raisins,  cranberries or dried fruit.    Try the prosciutto/mozzarella cheese sticks by Fiorruci  In deli /backery section   High protein      6 PM  Dinner:     Meat/fowl/fish with a green salad,  and either broccoli, cauliflower, green beans, spinach, brussel sprouts or  Lima beans. DO NOT BREAD THE PROTEIN!!      There is a low carb pasta by Dreamfield's that is acceptable and tastes great: only 5 digestible carbs/serving.( All grocery stores but BJs carry it )  Try Hurley Cisco Angelo's chicken piccata or chicken or eggplant parm over low carb pasta.(Lowes and BJs)   Marjory Lies Sanchez's "Carnitas" (pulled pork, no sauce,  0 carbs) or his beef pot roast to make a dinner burrito (at [employer]J's)  Pesto over low carb pasta (bj's sells a good quality pesto in the center refrigerated section of the deli   Try satueeing  Cheral Marker with mushroooms  Whole wheat pasta is still full of digestible carbs and  Not as low in glycemic index as Dreamfield's.   Brown rice is still rice,  So skip the rice and noodles if you eat [place] or [place] (or at least limit to  1/2 cup)  9 PM snack :   Breyer's "low carb" fudgsicle or  ice cream bar (Carb Smart line), or  Weight Watcher's ice cream bar , or another "no sugar added" ice cream;  a serving of fresh berries/cherries with whipped cream   Cheese or DANNON'S LlGHT N FIT GREEK YOGURT  8 ounces of [place] unsweetened almond/cococunut milk    Avoid bananas, pineapple, grapes  and watermelon on a regular basis because they are high in sugar.  THINK OF THEM AS DESSERT  Remember that snack Substitutions should be less than 10 NET carbs per serving and meals < 20 carbs. Remember to subtract fiber grams to get the "net carbs."  Health Maintenance Adopting a healthy lifestyle and getting preventive care can go a long way to promote health and wellness. Talk with your health care provider about what schedule of regular examinations is right for you. This is a good chance for you to check in with your provider about disease prevention and staying healthy. In between checkups, there are plenty of things you can do on your own. Experts have done a lot of research about which lifestyle changes and preventive measures are most likely to keep you healthy. Ask your health care provider for more information. WEIGHT AND DIET  Eat a healthy diet  Be sure to include plenty of vegetables, fruits, low-fat dairy products, and lean protein.  Do not eat a lot of foods high in solid fats, added sugars, or salt.  Get regular exercise. This is one of the most important things you can do for your health.  Most adults should exercise for at least 150 minutes each week. The exercise should increase your heart rate and make you sweat (moderate-intensity exercise).  Most adults should also do strengthening exercises at least twice a week. This is in addition to the moderate-intensity exercise.  Maintain a healthy weight  Body mass index (BMI) is a measurement that can be used to identify possible weight problems. It estimates  body fat based on height and weight. Your health care provider can help determine your BMI and help you achieve or maintain a healthy weight.  For females 60 years of age and older:   A BMI below 18.5 is considered underweight.  A BMI of 18.5 to 24.9 is normal.  A BMI of 25 to 29.9 is considered overweight.  A BMI of 30 and above is considered obese.  Watch levels of cholesterol and blood lipids  You should start having your blood tested for  lipids and cholesterol at 48 years of age, then have this test every 5 years.  You may need to have your cholesterol levels checked more often if:  Your lipid or cholesterol levels are high.  You are older than 48 years of age.  You are at high risk for heart disease.  CANCER SCREENING   Lung Cancer  Lung cancer screening is recommended for adults 80-89 years old who are at high risk for lung cancer because of a history of smoking.  A yearly low-dose CT scan of the lungs is recommended for people who:  Currently smoke.  Have quit within the past 15 years.  Have at least a 30-pack-year history of smoking. A pack year is smoking an average of one pack of cigarettes a day for 1 year.  Yearly screening should continue until it has been 15 years since you quit.  Yearly screening should stop if you develop a health problem that would prevent you from having lung cancer treatment.  Breast Cancer  Practice breast self-awareness. This means understanding how your breasts normally appear and feel.  It also means doing regular breast self-exams. Let your health care provider know about any changes, no matter how small.  If you are in your 20s or 30s, you should have a clinical breast exam (CBE) by a health care provider every 1-3 years as part of a regular health exam.  If you are 29 or older, have a CBE every year. Also consider having a breast X-ray (mammogram) every year.  If you have a family history of breast cancer, talk to your  health care provider about genetic screening.  If you are at high risk for breast cancer, talk to your health care provider about having an MRI and a mammogram every year.  Breast cancer gene (BRCA) assessment is recommended for women who have family members with BRCA-related cancers. BRCA-related cancers include:  Breast.  Ovarian.  Tubal.  Peritoneal cancers.  Results of the assessment will determine the need for genetic counseling and BRCA1 and BRCA2 testing. Cervical Cancer Routine pelvic examinations to screen for cervical cancer are no longer recommended for nonpregnant women who are considered low risk for cancer of the pelvic organs (ovaries, uterus, and vagina) and who do not have symptoms. A pelvic examination may be necessary if you have symptoms including those associated with pelvic infections. Ask your health care provider if a screening pelvic exam is right for you.   The Pap test is the screening test for cervical cancer for women who are considered at risk.  If you had a hysterectomy for a problem that was not cancer or a condition that could lead to cancer, then you no longer need Pap tests.  If you are older than 65 years, and you have had normal Pap tests for the past 10 years, you no longer need to have Pap tests.  If you have had past treatment for cervical cancer or a condition that could lead to cancer, you need Pap tests and screening for cancer for at least 20 years after your treatment.  If you no longer get a Pap test, assess your risk factors if they change (such as having a new sexual partner). This can affect whether you should start being screened again.  Some women have medical problems that increase their chance of getting cervical cancer. If this is the case for you, your health care provider may recommend more frequent screening and Pap tests.  The human papillomavirus (  HPV) test is another test that may be used for cervical cancer screening. The HPV  test looks for the virus that can cause cell changes in the cervix. The cells collected during the Pap test can be tested for HPV.  The HPV test can be used to screen women 68 years of age and older. Getting tested for HPV can extend the interval between normal Pap tests from three to five years.  An HPV test also should be used to screen women of any age who have unclear Pap test results.  After 48 years of age, women should have HPV testing as often as Pap tests.  Colorectal Cancer  This type of cancer can be detected and often prevented.  Routine colorectal cancer screening usually begins at 48 years of age and continues through 48 years of age.  Your health care provider may recommend screening at an earlier age if you have risk factors for colon cancer.  Your health care provider may also recommend using home test kits to check for hidden blood in the stool.  A small camera at the end of a tube can be used to examine your colon directly (sigmoidoscopy or colonoscopy). This is done to check for the earliest forms of colorectal cancer.  Routine screening usually begins at age 11.  Direct examination of the colon should be repeated every 5-10 years through 48 years of age. However, you may need to be screened more often if early forms of precancerous polyps or small growths are found. Skin Cancer  Check your skin from head to toe regularly.  Tell your health care provider about any new moles or changes in moles, especially if there is a change in a mole's shape or color.  Also tell your health care provider if you have a mole that is larger than the size of a pencil eraser.  Always use sunscreen. Apply sunscreen liberally and repeatedly throughout the day.  Protect yourself by wearing long sleeves, pants, a wide-brimmed hat, and sunglasses whenever you are outside. HEART DISEASE, DIABETES, AND HIGH BLOOD PRESSURE   Have your blood pressure checked at least every 1-2 years. High  blood pressure causes heart disease and increases the risk of stroke.  If you are between 50 years and 59 years old, ask your health care provider if you should take aspirin to prevent strokes.  Have regular diabetes screenings. This involves taking a blood sample to check your fasting blood sugar level.  If you are at a normal weight and have a low risk for diabetes, have this test once every three years after 48 years of age.  If you are overweight and have a high risk for diabetes, consider being tested at a younger age or more often. PREVENTING INFECTION  Hepatitis B  If you have a higher risk for hepatitis B, you should be screened for this virus. You are considered at high risk for hepatitis B if:  You were born in a country where hepatitis B is common. Ask your health care provider which countries are considered high risk.  Your parents were born in a high-risk country, and you have not been immunized against hepatitis B (hepatitis B vaccine).  You have HIV or AIDS.  You use needles to inject street drugs.  You live with someone who has hepatitis B.  You have had sex with someone who has hepatitis B.  You get hemodialysis treatment.  You take certain medicines for conditions, including cancer, organ transplantation, and autoimmune  conditions. Hepatitis C  Blood testing is recommended for:  Everyone born from [birth year] through 1965.  Anyone with known risk factors for hepatitis C. Sexually transmitted infections (STIs)  You should be screened for sexually transmitted infections (STIs) including gonorrhea and chlamydia if:  You are sexually active and are younger than 48 years of age.  You are older than 48 years of age and your health care provider tells you that you are at risk for this type of infection.  Your sexual activity has changed since you were last screened and you are at an increased risk for chlamydia or gonorrhea. Ask your health care provider if you are at  risk.  If you do not have HIV, but are at risk, it may be recommended that you take a prescription medicine daily to prevent HIV infection. This is called pre-exposure prophylaxis (PrEP). You are considered at risk if:  You are sexually active and do not regularly use condoms or know the HIV status of your partner(s).  You take drugs by injection.  You are sexually active with a partner who has HIV. Talk with your health care provider about whether you are at high risk of being infected with HIV. If you choose to begin PrEP, you should first be tested for HIV. You should then be tested every 3 months for as long as you are taking PrEP.  PREGNANCY   If you are premenopausal and you may become pregnant, ask your health care provider about preconception counseling.  If you may become pregnant, take 400 to 800 micrograms (mcg) of folic acid every day.  If you want to prevent pregnancy, talk to your health care provider about birth control (contraception). OSTEOPOROSIS AND MENOPAUSE   Osteoporosis is a disease in which the bones lose minerals and strength with aging. This can result in serious bone fractures. Your risk for osteoporosis can be identified using a bone density scan.  If you are 1 years of age or older, or if you are at risk for osteoporosis and fractures, ask your health care provider if you should be screened.  Ask your health care provider whether you should take a calcium or vitamin D supplement to lower your risk for osteoporosis.  Menopause may have certain physical symptoms and risks.  Hormone replacement therapy may reduce some of these symptoms and risks. Talk to your health care provider about whether hormone replacement therapy is right for you.  HOME CARE INSTRUCTIONS   Schedule regular health, dental, and eye exams.  Stay current with your immunizations.   Do not use any tobacco products including cigarettes, chewing tobacco, or electronic cigarettes.  If  you are pregnant, do not drink alcohol.  If you are breastfeeding, limit how much and how often you drink alcohol.  Limit alcohol intake to no more than 1 drink per day for nonpregnant women. One drink equals 12 ounces of beer, 5 ounces of wine, or 1 ounces of hard liquor.  Do not use street drugs.  Do not share needles.  Ask your health care provider for help if you need support or information about quitting drugs.  Tell your health care provider if you often feel depressed.  Tell your health care provider if you have ever been abused or do not feel safe at home. Document Released: 12/24/2010 Document Revised: 10/25/2013 Document Reviewed: 05/12/2013 [facility] Patient Information 2015 [place], [place]. This information is not intended to replace advice given to you by your health care provider. Make sure you  discuss any questions you have with your health care provider.  

## 2014-04-27 LAB — TSH: TSH: 1.91 u[IU]/mL (ref 0.35–4.50)

## 2014-04-28 DIAGNOSIS — E663 Overweight: Secondary | ICD-10-CM | POA: Insufficient documentation

## 2014-04-28 DIAGNOSIS — R635 Abnormal weight gain: Secondary | ICD-10-CM | POA: Insufficient documentation

## 2014-04-28 LAB — VITAMIN D 25 HYDROXY (VIT D DEFICIENCY, FRACTURES): VITD: 23.5 ng/mL — ABNORMAL LOW (ref 30.00–100.00)

## 2014-04-28 LAB — CYTOLOGY - PAP

## 2014-04-28 NOTE — Assessment & Plan Note (Signed)
Annual  wellness  exam was done as well as a comprehensive physical exam and management of acute and chronic conditions .  During the course of the visit the patient was educated and counseled about appropriate screening and preventive services including : fall prevention , diabetes screening, nutrition counseling, colorectal cancer screening, and recommended immunizations.  Printed recommendations for health maintenance screenings was given.  

## 2014-04-28 NOTE — Assessment & Plan Note (Signed)
Intentional from obese BMI at 198 lbs  To 153 lbs with some weight gain since altering her diet. I have congratulated her in reduction of   BMI and encouraged  Continued weight management usin g  a low glycemic index diet and regular exercise a minimum of 5 days per week.

## 2014-04-29 ENCOUNTER — Encounter: Payer: Self-pay | Admitting: *Deleted

## 2014-04-29 DIAGNOSIS — E559 Vitamin D deficiency, unspecified: Secondary | ICD-10-CM | POA: Insufficient documentation

## 2014-04-29 MED ORDER — ERGOCALCIFEROL 1.25 MG (50000 UT) PO CAPS: 50000.0000 [IU] | ORAL_CAPSULE | ORAL | Status: DC

## 2014-04-29 NOTE — Addendum Note (Signed)
Addended by: Crecencio Mc on: 04/29/2014 10:43 AM   Modules accepted: Orders

## 2014-05-27 ENCOUNTER — Encounter: Payer: Self-pay | Admitting: *Deleted

## 2014-05-27 ENCOUNTER — Ambulatory Visit: Payer: Self-pay | Admitting: Internal Medicine

## 2014-05-27 LAB — HM MAMMOGRAPHY: HM Mammogram: NEGATIVE

## 2014-05-31 ENCOUNTER — Encounter: Payer: Self-pay | Admitting: Internal Medicine

## 2014-06-07 ENCOUNTER — Encounter: Payer: Self-pay | Admitting: Internal Medicine

## 2014-11-25 ENCOUNTER — Ambulatory Visit (INDEPENDENT_AMBULATORY_CARE_PROVIDER_SITE_OTHER): Payer: BLUE CROSS/BLUE SHIELD

## 2014-11-25 ENCOUNTER — Encounter: Payer: Self-pay | Admitting: Podiatry

## 2014-11-25 ENCOUNTER — Ambulatory Visit (INDEPENDENT_AMBULATORY_CARE_PROVIDER_SITE_OTHER): Payer: BLUE CROSS/BLUE SHIELD | Admitting: Podiatry

## 2014-11-25 ENCOUNTER — Ambulatory Visit: Payer: BLUE CROSS/BLUE SHIELD | Admitting: Podiatry

## 2014-11-25 VITALS — BP 131/86 | HR 87 | Resp 16

## 2014-11-25 DIAGNOSIS — M722 Plantar fascial fibromatosis: Secondary | ICD-10-CM

## 2014-11-25 DIAGNOSIS — D492 Neoplasm of unspecified behavior of bone, soft tissue, and skin: Secondary | ICD-10-CM | POA: Diagnosis not present

## 2014-11-26 NOTE — Progress Notes (Signed)
She presents today with a chief complaint of a painful mass to the plantar aspect of her right foot. She states this just been there for a couple of weeks and is extremely painful particularly with her shoe gear or the way that she steps on it. She denies any trauma to the foot and she denies any changes in her past medical history medications allergy surgeries were social history.  Objective: Vital signs are stable she is alert and oriented 3. Pulses are strongly palpable. Neurologic sensorium is intact per Semmes-Weinstein monofilament. Deep tendon reflexes are intact orthopedic evaluation of his joints all joints distal to the ankle for range of motion without crepitation. Cutaneous evaluation does demonstrate a very small BB sized bluish nonpulsatile nodule to the proximal medial longitudinal arch area of the right foot. Radiographs do not demonstrate any type of ossification this appears to be thrombosed vein and/or a cutaneous neuroma.  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  Plan: Encouraged warm compresses and an 81 mg aspirin daily. Should this not resolve in the next couple weeks I did this suggest that she return to our office for surgical consult for removal of this lesion.

## 2014-12-26 ENCOUNTER — Ambulatory Visit
Admission: EM | Admit: 2014-12-26 | Discharge: 2014-12-26 | Disposition: A | Discharge status: Home / Self Care | Payer: BLUE CROSS/BLUE SHIELD | Attending: Internal Medicine | Admitting: Internal Medicine

## 2014-12-26 DIAGNOSIS — S61011A Laceration without foreign body of right thumb without damage to nail, initial encounter: Secondary | ICD-10-CM

## 2014-12-26 MED ORDER — TETANUS-DIPHTH-ACELL PERTUSSIS 5-2.5-18.5 LF-MCG/0.5 IM SUSP
0.5000 mL | Freq: Once | INTRAMUSCULAR | Status: AC
  Administered 2014-12-26: 0.5 mL via INTRAMUSCULAR

## 2014-12-26 MED ORDER — MUPIROCIN 2 % EX OINT: 1.0000 "application " | TOPICAL_OINTMENT | Freq: Three times a day (TID) | CUTANEOUS | Status: DC

## 2014-12-26 NOTE — Discharge Instructions (Signed)
Delayed Wound Closure Sometimes, your health care provider will decide to delay closing a wound for several days. This is done when the wound is badly bruised, dirty, or when it has been several hours since the injury happened. By delaying the closure of your wound, the risk of infection is reduced. Wounds that are closed in 3-7 days after being cleaned up and dressed heal just as well as those that are closed right away. HOME CARE INSTRUCTIONS  Rest and elevate the injured area until the pain and swelling are gone.  Have your wound checked as instructed by your health care provider. SEEK MEDICAL CARE IF:  You develop unusual or increased swelling or redness around the wound.  You have increasing pain or tenderness.  There is increasing fluid (drainage) or a bad smelling drainage coming from the wound. Document Released: 06/10/2005 Document Revised: 06/15/2013 Document Reviewed: 12/08/2012 [facility] Patient Information 2015 [place], [place]. This information is not intended to replace advice given to you by your health care provider. Make sure you discuss any questions you have with your health care provider.

## 2014-12-26 NOTE — ED Provider Notes (Signed)
CSN: [id number]     Arrival date & time 12/26/14  1026 History   First MD Initiated Contact with Patient 12/26/14 1057     Chief Complaint  Patient presents with  . Laceration   (Consider location/radiation/quality/duration/timing/severity/associated sxs/prior Treatment) HPI  This a 49 year old right-hand-dominant female who cut her right ulnar side of her right thumb with a pineapple corer' 4 PM yesterday afternoon about 18 hours ago. She states that at first bleeding was brisk despite pressure; she put off going to the emergency department not wanting to sit there all night long. This morning while working out at the gym she saw a friend who was an Therapist, sports who after looking at her wound ,that she should have it looked at for possible sutures. During her work out ,the wound was Bleeding as she was lifting weights. Now she presents with a flap type laceration which is barely in the subcutaneous tissues on the ulnar aspect of the right thumb overlying the IP joint. With thumb flexion the wound separates a minimal amount but there is no active bleeding at the present time. She does not remember her last  tetanus injection.  Past Medical History  Diagnosis Date  . Chicken pox   . Allergy   . Migraines    Past Surgical History  Procedure Laterality Date  . 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 Arthritis Father   . Diabetes Father   . Cancer Maternal Uncle     colon  . Cancer Paternal Aunt     lung  . Arthritis Maternal Grandfather   . Cancer Maternal Grandfather     colon  . Cancer Paternal Grandfather     carcinoma of the skin  . Cancer Maternal Uncle     colon   History  Substance Use Topics  . Smoking status: Never Smoker   . Smokeless tobacco: Never Used  . Alcohol Use: 1.2 oz/week    1 Glasses of wine, 1 Shots of liquor per week     Comment: occasionally   OB History    Gravida Para Term Preterm AB TAB SAB Ectopic  Multiple Living   1 1        1       Obstetric Comments   First Period 13 First pregnancy 31     Review of Systems  All other systems reviewed and are negative.   Allergies  Penicillins  Home Medications   Prior to Admission medications   Medication Sig Start Date End Date Taking? Authorizing Provider  ergocalciferol (DRISDOL) 50000 UNITS capsule Take 1 capsule (50,000 Units total) by mouth once a week. 04/29/14   Crecencio Mc, MD  ibuprofen (ADVIL,MOTRIN) 600 MG tablet Take 1 tablet by mouth as needed. 11/09/12   Historical Provider, MD  mupirocin ointment (BACTROBAN) 2 % Apply 1 application topically 3 (three) times daily. 12/26/14   Malachy Chamber Roemer, PA-C   BP 125/74 mmHg  Pulse 76  Temp(Src) 97.1 F (36.2 C) (Tympanic)  Resp 16  Ht 5\' 8"  (1.727 m)  Wt 178 lb (80.74 kg)  BMI 27.07 kg/m2  SpO2 100%  LMP 12/05/2014 (Approximate) Physical Exam  Constitutional: She is oriented to person, place, and time. She appears well-developed and well-nourished.  HENT:  Head: Normocephalic and atraumatic.  Eyes: EOM are normal. Pupils are equal, round, and reactive to light.  Musculoskeletal: Normal range of motion.  Neurological: She is  alert and oriented to person, place, and time.  Skin: Skin is warm and dry.  Examination the right thumb shows a flap type laceration on the ulnar aspect overlying the IP joint. The laceration extends into the cutaneous tissue with the base of the flap proximal. IP joint moves freely. Station is intact distally to the wound. Tendons have good pull-through are strong. Is no evidence of infection at the present time.  Psychiatric: She has a normal mood and affect. Her behavior is normal. Judgment and thought content normal.  Nursing note and vitals reviewed.   ED Course  Procedures (including critical care time) Labs Review Labs Reviewed - No data to display  Imaging Review No results found. 11:10 Medication Given MH  Tdap (BOOSTRIX) injection 0.5  mL - Dose: 0.5 mL ; Route: Intramuscular ; Site: Left Deltoid ; Scheduled Time: 1115     11:10 Medication Given MH  Tdap (BOOSTRIX) injection 0.5 mL - Dose: 0.5 mL ; Route: Intramuscular ; Site: Left Deltoid ; Scheduled Time: 1115       MDM   1. Laceration of right thumb with delay in treatment, initial encounter    Had a discussion with the patient regarding her laceration being over the IP joint separation with IP joint movement. She's been 18 hours since the laceration occurred and although suturing may help the IP from flexion she is at the absolute maximum for placement of sutures because of infection considerations. I've given her the option and after discussion she decided not have sutures we will dress the wound in a bulky dressing and splinted into extension for several days. Keep the wound dry for 24 hours; she may change her dressings at that time apply [place] times a day until healed. She was given a volar splint to keep the IP joint from flexion; encouraged her to keep this on several days as much as possible. We have reviewed signs and symptoms of infection and she will return if any of these occur. She is given a DTaP injection.  New Prescriptions   MUPIROCIN OINTMENT (BACTROBAN) 2 %    Apply 1 application topically 3 (three) times daily.   Plan: 1. Diagnosis reviewed with patient 2. rx as per orders; risks, benefits, potential side effects reviewed with patient 3. Recommend supportive treatment with elevation, avoid use as much as possible. 4. F/u prn if symptoms worsen or don't improve   Lorin Picket, PA-C 12/26/14 1203

## 2014-12-26 NOTE — ED Notes (Signed)
Patient was cutting a pineapple yesterday with a pampered chef tool. It flipped and sliced her right thumb. She states that it has bled quite a bit last night. She states that the injury happened around 4pm on 12/25/2014.

## 2015-09-18 ENCOUNTER — Ambulatory Visit (INDEPENDENT_AMBULATORY_CARE_PROVIDER_SITE_OTHER): Payer: BLUE CROSS/BLUE SHIELD | Admitting: Internal Medicine

## 2015-09-18 VITALS — BP 110/74 | HR 86 | Temp 98.6°F | Resp 12 | Ht 67.0 in | Wt 184.0 lb

## 2015-09-18 DIAGNOSIS — D171 Benign lipomatous neoplasm of skin and subcutaneous tissue of trunk: Secondary | ICD-10-CM | POA: Diagnosis not present

## 2015-09-18 DIAGNOSIS — R079 Chest pain, unspecified: Secondary | ICD-10-CM | POA: Diagnosis not present

## 2015-09-18 DIAGNOSIS — R0609 Other forms of dyspnea: Secondary | ICD-10-CM | POA: Diagnosis not present

## 2015-09-18 DIAGNOSIS — E663 Overweight: Secondary | ICD-10-CM | POA: Diagnosis not present

## 2015-09-18 DIAGNOSIS — N943 Premenstrual tension syndrome: Secondary | ICD-10-CM | POA: Diagnosis not present

## 2015-09-18 DIAGNOSIS — Z0000 Encounter for general adult medical examination without abnormal findings: Secondary | ICD-10-CM

## 2015-09-18 MED ORDER — SPIRONOLACTONE 25 MG PO TABS: 25.0000 mg | ORAL_TABLET | Freq: Every day | ORAL | Status: DC

## 2015-09-18 NOTE — Progress Notes (Signed)
Patient ID: Alice Spencer, female    DOB: October 09, 1965  Age: 50 y.o. MRN: [id number]  The patient is here for annual  wellness examination and management of other chronic and acute problems.    Last seen Nov 2015  PAP normal Nov 2015   The risk factors are reflected in the social history.  The roster of all physicians providing medical care to patient - is listed in the Snapshot section of the chart.  Activities of daily living:  The patient is 100% independent in all ADLs: dressing, toileting, feeding as well as independent mobility  Home safety : The patient has smoke detectors in the home. They wear seatbelts.  There are no firearms at home. There is no violence in the home.   There is no risks for hepatitis, STDs or HIV. There is no   history of blood transfusion. They have no travel history to infectious disease endemic areas of the world.  The patient has seen their dentist in the last six month. They have seen their eye doctor in the last year. They admit to slight hearing difficulty with regard to whispered voices and some television programs.  They have deferred audiologic testing in the last year.  They do not  have excessive sun exposure. Discussed the need for sun protection: hats, long sleeves and use of sunscreen if there is significant sun exposure.   Diet: the importance of a healthy diet is discussed. They do have a healthy diet.  The benefits of regular aerobic exercise were discussed. She walks 4 times per week ,  20 minutes.   Depression screen: there are no signs or vegative symptoms of depression- irritability, change in appetite, anhedonia, sadness/tearfullness.  Cognitive assessment: the patient manages all their financial and personal affairs and is actively engaged. They could relate day,date,year and events; recalled 2/3 objects at 3 minutes; performed clock-face test normally.  The following portions of the patient's history were reviewed and updated as appropriate:  allergies, current medications, past family history, past medical history,  past surgical history, past social history  and problem list.  Visual acuity was not assessed per patient preference since she has regular follow up with her ophthalmologist. Hearing and body mass index were assessed and reviewed.   During the course of the visit the patient was educated and counseled about appropriate screening and preventive services including : fall prevention , diabetes screening, nutrition counseling, colorectal cancer screening, and recommended immunizations.    CC: The primary encounter diagnosis was Premenstrual syndrome. Diagnoses of Lipoma of torso, Chest pain, unspecified chest pain type, Encounter for preventive health examination, Overweight (BMI 25.0-29.9), and Exertional dyspnea were also pertinent to this visit.   Weight loss reviewed..  nadir 153 in 2015,  Gained 7 lbs since last summer   Gets winded and chest tightness with climbing stairs but not with other vigorous ones   Left side of torso has a fleshy sub Q mass that interferes with lateral side bends.   History Alice Spencer has a past medical history of Chicken pox; Allergy; and Migraines.   She has past surgical history that includes Breast surgery (2009).   Her family history includes Arthritis in her father and maternal grandfather; Cancer in her maternal grandfather, maternal uncle, maternal uncle, paternal aunt, and paternal grandfather; Diabetes in her father.She reports that she has never smoked. She has never used smokeless tobacco. She reports that she drinks about 1.2 oz of alcohol per week. She reports that she does not  use illicit drugs.  Outpatient Prescriptions Prior to Visit  Medication Sig Dispense Refill  . ibuprofen (ADVIL,MOTRIN) 600 MG tablet Take 1 tablet by mouth as needed.    . ergocalciferol (DRISDOL) 50000 UNITS capsule Take 1 capsule (50,000 Units total) by mouth once a week. 12 capsule 0  . mupirocin ointment  (BACTROBAN) 2 % Apply 1 application topically 3 (three) times daily. 22 g 0   No facility-administered medications prior to visit.    Review of Systems   Patient denies headache, fevers, malaise, unintentional weight loss, skin rash, eye pain, sinus congestion and sinus pain, sore throat, dysphagia,  hemoptysis , cough, dyspnea, wheezing, chest pain, palpitations, orthopnea, edema, abdominal pain, nausea, melena, diarrhea, constipation, flank pain, dysuria, hematuria, urinary  Frequency, nocturia, numbness, tingling, seizures,  Focal weakness, Loss of consciousness,  Tremor, insomnia, depression, anxiety, and suicidal ideation.      Objective:  BP 110/74 mmHg  Pulse 86  Temp(Src) 98.6 F (37 C) (Oral)  Resp 12  Ht 5\' 7"  (1.702 m)  Wt 184 lb (83.462 kg)  BMI 28.81 kg/m2  SpO2 99%  LMP 09/04/2015  Physical Exam   General appearance: alert, cooperative and appears stated age Head: Normocephalic, without obvious abnormality, atraumatic Eyes: conjunctivae/corneas clear. PERRL, EOM's intact. Fundi benign. Ears: normal TM's and external ear canals both ears Nose: Nares normal. Septum midline. Mucosa normal. No drainage or sinus tenderness. Throat: lips, mucosa, and tongue normal; teeth and gums normal Neck: no adenopathy, no carotid bruit, no JVD, supple, symmetrical, trachea midline and thyroid not enlarged, symmetric, no tenderness/mass/nodules Lungs: clear to auscultation bilaterally Breasts: normal appearance, no masses or tenderness Heart: regular rate and rhythm, S1, S2 normal, no murmur, click, rub or gallop Abdomen: soft, non-tender; bowel sounds normal; no masses,  no organomegaly Extremities: extremities normal, atraumatic, no cyanosis or edema Pulses: 2+ and symmetric Skin: Skin color, texture, turgor normal. No rashes or lesions Neurologic: Alert and oriented X 3, normal strength and tone. Normal symmetric reflexes. Normal coordination and gait.     Assessment & Plan:    Problem List Items Addressed This Visit    Encounter for preventive health examination    Annual comprehensive preventive exam was done as well as an evaluation and management of chronic conditions .  During the course of the visit the patient was educated and counseled about appropriate screening and preventive services including :  diabetes screening, lipid analysis with projected  10 year  risk for CAD , nutrition counseling, breast, cervical and colorectal cancer screening, and recommended immunizations.  Printed recommendations for health maintenance screenings was give       Overweight (BMI 25.0-29.9)    I have addressed  BMI and recommended wt loss of 10% of body weigh over the next 6 months using a low glycemic index diet and regular exercise a minimum of 5 days per week.  She is attending a weight loss clinic that uses HCG to stimulate weight loss. Fasting labs were done by weight loss clinic and reviewed today with patient.  No signs of diabetes or thyroid issue.s   .Body mass index is 28.81 kg/(m^2).       Lipoma of torso    Referral to [facility] for removal.       Relevant Orders   Ambulatory referral to General Surgery   Exertional dyspnea    She has no chest pain with other forms of aerobic exercise and no identifiable risk factors for CAD.  Baseline EKG done and  no signs of ischemia seen .      Premenstrual syndrome - Primary   Relevant Medications   spironolactone (ALDACTONE) 25 MG tablet    Other Visit Diagnoses    Chest pain, unspecified chest pain type        Relevant Orders    EKG 12-Lead (Completed)       I have discontinued Ms. Louth's ergocalciferol and mupirocin ointment. I am also having her start on spironolactone. Additionally, I am having her maintain her ibuprofen and Vitamin D3.  Meds ordered this encounter  Medications  . Cholecalciferol (VITAMIN D3) 1000 units CAPS    Sig: Take 1 capsule by mouth daily.  Marland Kitchen spironolactone (ALDACTONE) 25 MG  tablet    Sig: Take 1 tablet (25 mg total) by mouth daily. As needed for fluid retention    Dispense:  30 tablet    Refill:  3    Medications Discontinued During This Encounter  Medication Reason  . ergocalciferol (DRISDOL) 50000 UNITS capsule Completed Course  . mupirocin ointment (BACTROBAN) 2 % Completed Course    Follow-up: No Follow-up on file.   Crecencio Mc, MD

## 2015-09-18 NOTE — Progress Notes (Signed)
Pre-visit discussion using our clinic review tool. No additional management support is needed unless otherwise documented below in the visit note.  

## 2015-09-19 ENCOUNTER — Encounter: Payer: Self-pay | Admitting: *Deleted

## 2015-09-19 DIAGNOSIS — R0609 Other forms of dyspnea: Secondary | ICD-10-CM | POA: Insufficient documentation

## 2015-09-19 DIAGNOSIS — D171 Benign lipomatous neoplasm of skin and subcutaneous tissue of trunk: Secondary | ICD-10-CM | POA: Insufficient documentation

## 2015-09-19 NOTE — Assessment & Plan Note (Signed)
I have addressed  BMI and recommended wt loss of 10% of body weigh over the next 6 months using a low glycemic index diet and regular exercise a minimum of 5 days per week.  She is attending a weight loss clinic that uses HCG to stimulate weight loss. Fasting labs were done by weight loss clinic and reviewed today with patient.  No signs of diabetes or thyroid issue.s   .Body mass index is 28.81 kg/(m^2).

## 2015-09-19 NOTE — Assessment & Plan Note (Signed)
Annual comprehensive preventive exam was done as well as an evaluation and management of chronic conditions .  During the course of the visit the patient was educated and counseled about appropriate screening and preventive services including :  diabetes screening, lipid analysis with projected  10 year  risk for CAD , nutrition counseling, breast, cervical and colorectal cancer screening, and recommended immunizations.  Printed recommendations for health maintenance screenings was give 

## 2015-09-19 NOTE — Assessment & Plan Note (Signed)
She has no chest pain with other forms of aerobic exercise and no identifiable risk factors for CAD.  Baseline EKG done and no signs of ischemia seen .

## 2015-09-19 NOTE — Assessment & Plan Note (Signed)
Referral to [facility] for removal.

## 2015-09-28 ENCOUNTER — Encounter: Payer: Self-pay | Admitting: Internal Medicine

## 2015-09-29 ENCOUNTER — Other Ambulatory Visit: Payer: Self-pay

## 2015-09-29 DIAGNOSIS — Z1239 Encounter for other screening for malignant neoplasm of breast: Secondary | ICD-10-CM

## 2015-10-05 ENCOUNTER — Encounter: Payer: Self-pay | Admitting: General Surgery

## 2015-10-05 ENCOUNTER — Ambulatory Visit (INDEPENDENT_AMBULATORY_CARE_PROVIDER_SITE_OTHER): Payer: BLUE CROSS/BLUE SHIELD | Admitting: General Surgery

## 2015-10-05 DIAGNOSIS — D179 Benign lipomatous neoplasm, unspecified: Secondary | ICD-10-CM

## 2015-10-05 DIAGNOSIS — Z1211 Encounter for screening for malignant neoplasm of colon: Secondary | ICD-10-CM | POA: Diagnosis not present

## 2015-10-05 MED ORDER — POLYETHYLENE GLYCOL 3350 17 GM/SCOOP PO POWD: ORAL | Status: DC

## 2015-10-05 NOTE — Patient Instructions (Addendum)
The patient is aware to call back for any questions or concerns. Colonoscopy A colonoscopy is an exam to look at the entire large intestine (colon). This exam can help find problems such as tumors, polyps, inflammation, and areas of bleeding. The exam takes about 1 hour.  LET [facility] CARE PROVIDER KNOW ABOUT:   Any allergies you have.  All medicines you are taking, including vitamins, herbs, eye drops, creams, and over-the-counter medicines.  Previous problems you or members of your family have had with the use of anesthetics.  Any blood disorders you have.  Previous surgeries you have had.  Medical conditions you have. RISKS AND COMPLICATIONS  Generally, this is a safe procedure. However, as with any procedure, complications can occur. Possible complications include:  Bleeding.  Tearing or rupture of the colon wall.  Reaction to medicines given during the exam.  Infection (rare). BEFORE THE PROCEDURE   Ask your health care provider about changing or stopping your regular medicines.  You may be prescribed an oral bowel prep. This involves drinking a large amount of medicated liquid, starting the day before your procedure. The liquid will cause you to have multiple loose stools until your stool is almost clear or light green. This cleans out your colon in preparation for the procedure.  Do not eat or drink anything else once you have started the bowel prep, unless your health care provider tells you it is safe to do so.  Arrange for someone to drive you home after the procedure. PROCEDURE   You will be given medicine to help you relax (sedative).  You will lie on your side with your knees bent.  A long, flexible tube with a light and camera on the end (colonoscope) will be inserted through the rectum and into the colon. The camera sends video back to a computer screen as it moves through the colon. The colonoscope also releases carbon dioxide gas to inflate the colon. This  helps your health care provider see the area better.  During the exam, your health care provider may take a small tissue sample (biopsy) to be examined under a microscope if any abnormalities are found.  The exam is finished when the entire colon has been viewed. AFTER THE PROCEDURE   Do not drive for 24 hours after the exam.  You may have a small amount of blood in your stool.  You may pass moderate amounts of gas and have mild abdominal cramping or bloating. This is caused by the gas used to inflate your colon during the exam.  Ask when your test results will be ready and how you will get your results. Make sure you get your test results.   This information is not intended to replace advice given to you by your health care provider. Make sure you discuss any questions you have with your health care provider.   Document Released: 06/07/2000 Document Revised: 03/31/2013 Document Reviewed: 02/15/2013 Elsevier Interactive Patient Education [employer].  Patient has been scheduled for a colonoscopy on 11-08-15 at [facility].

## 2015-10-05 NOTE — Progress Notes (Signed)
Patient ID: Alice Spencer, female   DOB: 1965/09/06, 50 y.o.   MRN: [id number]  Chief Complaint  Patient presents with  . Lipoma    HPI Alice Spencer is a 50 y.o. female.  Here today for evaluation of a possible lipoma left abdomen. She states she noticed the area about 8 years ago. She states Dr. Amalia Hailey evaluated the area at first.  She states it may be a little larger. She states it is more uncomfortable after exercises and stretches. She has noticed 2 areas of concern.  The patient was recently advised that as she is turned 62 she is a candidate for a screening colonoscopy.   I personally reviewed the patient's history.   HPI  Past Medical History  Diagnosis Date  . Chicken pox   . Allergy   . Migraines     Past Surgical History  Procedure Laterality Date  . Tonsillectomy  1973  . 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 Breast biopsy Left 2014    calcification    Family History  Problem Relation Age of Onset  . Arthritis Father   . Diabetes Father   . Cancer Maternal Uncle     colon  . Cancer Paternal Aunt     lung  . Arthritis Maternal Grandfather   . Cancer Maternal Grandfather     colon  . Cancer Paternal Grandfather     carcinoma of the skin  . Cancer Maternal Uncle     colon    Social History Social History  Substance Use Topics  . Smoking status: Never Smoker   . Smokeless tobacco: Never Used  . Alcohol Use: 1.2 oz/week    1 Glasses of wine, 1 Shots of liquor per week     Comment: occasionally    Allergies  Allergen Reactions  . Penicillins Rash    Current Outpatient Prescriptions  Medication Sig Dispense Refill  . Cholecalciferol (VITAMIN D3) 1000 units CAPS Take 1 capsule by mouth daily.    Marland Kitchen ibuprofen (ADVIL,MOTRIN) 600 MG tablet Take 1 tablet by mouth as needed.    Marland Kitchen spironolactone (ALDACTONE) 25 MG tablet Take 1 tablet (25 mg total) by mouth daily. As needed for fluid retention 30 tablet 3  . polyethylene glycol powder  (GLYCOLAX/MIRALAX) powder 255 grams one bottle for colonoscopy prep 255 g 0   No current facility-administered medications for this visit.    Review of Systems Review of Systems  Constitutional: Negative.   Respiratory: Negative.   Cardiovascular: Negative.     Blood pressure 130/74, pulse 78, resp. rate 12, height 5\' 8"  (1.727 m), weight 178 lb (80.74 kg), last menstrual period 09/29/2015.  Physical Exam Physical Exam  Constitutional: She is oriented to person, place, and time. She appears well-developed and well-nourished.  HENT:  Mouth/Throat: Oropharynx is clear and moist.  Eyes: Conjunctivae are normal.  Neck: Neck supple.  Cardiovascular: Normal rate, regular rhythm and normal heart sounds.   Pulmonary/Chest: Effort normal and breath sounds normal.    Abdominal: Soft. Normal appearance. There is no tenderness.  3 x 4 cm soft tissue mass left mid axillary line at  T 11 and a 1 cm lesion left costal margin.  Lymphadenopathy:    She has no cervical adenopathy.  Neurological: She is alert and oriented to person, place, and time.  Skin: Skin is warm and dry.  Psychiatric: Her behavior is normal.    Data Reviewed PCP note of 09/18/2015  reviewed.  CBC and comprehensive metabolic panel of 123456 reviewed. Normal renal function. Normal CBC.  Assessment    Lipoma left chest wall/trunk.  Candidate for screening colonoscopy.    Plan    Indications for resection of the lipoma were reviewed: 1) enlargement and 2) discomfort. Both of these indications are present.    Discussed observation vs excision.   Colonoscopy with possible biopsy/polypectomy prn: Information regarding the procedure, including its potential risks and complications (including but not limited to perforation of the bowel, which may require emergency surgery to repair, and bleeding) was verbally given to the patient. Educational information regarding lower intestinal endoscopy was given to the patient.  Written instructions for how to complete the bowel prep using Miralax were provided. The importance of drinking ample fluids to avoid dehydration as a result of the prep emphasized.  Patient has been scheduled for a colonoscopy on 11-08-15 at [facility].   PCP:  Deborra Medina  This information has been scribed by Karie Fetch RN, BSN,BC.   Robert Bellow 10/05/2015, 12:12 PM

## 2015-10-09 ENCOUNTER — Ambulatory Visit (INDEPENDENT_AMBULATORY_CARE_PROVIDER_SITE_OTHER): Payer: BLUE CROSS/BLUE SHIELD | Admitting: General Surgery

## 2015-10-09 ENCOUNTER — Ambulatory Visit
Admission: RE | Admit: 2015-10-09 | Discharge: 2015-10-09 | Disposition: A | Discharge status: Home / Self Care | Payer: BLUE CROSS/BLUE SHIELD | Source: Ambulatory Visit | Attending: Internal Medicine | Admitting: Internal Medicine

## 2015-10-09 ENCOUNTER — Encounter: Payer: Self-pay | Admitting: General Surgery

## 2015-10-09 ENCOUNTER — Other Ambulatory Visit: Payer: Self-pay | Admitting: Internal Medicine

## 2015-10-09 VITALS — BP 140/78 | HR 78 | Resp 12 | Ht 68.0 in | Wt 178.0 lb

## 2015-10-09 DIAGNOSIS — Z1231 Encounter for screening mammogram for malignant neoplasm of breast: Secondary | ICD-10-CM | POA: Insufficient documentation

## 2015-10-09 DIAGNOSIS — R1900 Intra-abdominal and pelvic swelling, mass and lump, unspecified site: Secondary | ICD-10-CM

## 2015-10-09 DIAGNOSIS — Z1239 Encounter for other screening for malignant neoplasm of breast: Secondary | ICD-10-CM

## 2015-10-09 DIAGNOSIS — D179 Benign lipomatous neoplasm, unspecified: Secondary | ICD-10-CM

## 2015-10-09 DIAGNOSIS — D213 Benign neoplasm of connective and other soft tissue of thorax: Secondary | ICD-10-CM | POA: Diagnosis not present

## 2015-10-09 DIAGNOSIS — D216 Benign neoplasm of connective and other soft tissue of trunk, unspecified: Secondary | ICD-10-CM | POA: Diagnosis not present

## 2015-10-09 DIAGNOSIS — D171 Benign lipomatous neoplasm of skin and subcutaneous tissue of trunk: Secondary | ICD-10-CM | POA: Diagnosis not present

## 2015-10-09 MED ORDER — HYDROCODONE-ACETAMINOPHEN 5-325 MG PO TABS: 1.0000 | ORAL_TABLET | ORAL | Status: DC | PRN

## 2015-10-09 NOTE — Progress Notes (Addendum)
Patient ID: Alice Spencer, female   DOB: 1965-09-18, 50 y.o.   MRN: [id number]  Chief Complaint  Patient presents with  . Procedure    excision    HPI Alice Spencer is a 50 y.o. female here today for a excision on left side.  HPI  Past Medical History  Diagnosis Date  . Chicken pox   . Allergy   . Migraines     Past Surgical History  Procedure Laterality Date  . Tonsillectomy  1973  . 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 Breast biopsy Left 2014    stereo for calcification, neg  . Breast excisional biopsy Left 09/28/2007    neg    Family History  Problem Relation Age of Onset  . Arthritis Father   . Diabetes Father   . Cancer Maternal Uncle     colon  . Cancer Paternal Aunt     lung  . Arthritis Maternal Grandfather   . Cancer Maternal Grandfather     colon  . Cancer Paternal Grandfather     carcinoma of the skin  . Cancer Maternal Uncle     colon    Social History Social History  Substance Use Topics  . Smoking status: Never Smoker   . Smokeless tobacco: Never Used  . Alcohol Use: 1.2 oz/week    1 Glasses of wine, 1 Shots of liquor per week     Comment: occasionally    Allergies  Allergen Reactions  . Penicillins Rash    Current Outpatient Prescriptions  Medication Sig Dispense Refill  . Cholecalciferol (VITAMIN D3) 1000 units CAPS Take 1 capsule by mouth daily.    Marland Kitchen ibuprofen (ADVIL,MOTRIN) 600 MG tablet Take 1 tablet by mouth as needed.    . polyethylene glycol powder (GLYCOLAX/MIRALAX) powder 255 grams one bottle for colonoscopy prep 255 g 0  . spironolactone (ALDACTONE) 25 MG tablet Take 1 tablet (25 mg total) by mouth daily. As needed for fluid retention 30 tablet 3  . HYDROcodone-acetaminophen (NORCO) 5-325 MG tablet Take 1-2 tablets by mouth every 4 (four) hours as needed for moderate pain. 20 tablet 0   No current facility-administered medications for this visit.    Review of Systems Review of Systems  Constitutional:  Negative.   Respiratory: Negative.   Cardiovascular: Negative.     Blood pressure 140/78, pulse 78, resp. rate 12, height 5\' 8"  (1.727 m), weight 178 lb (80.74 kg), last menstrual period 09/29/2015.  Physical Exam Physical Exam  Constitutional: She is oriented to person, place, and time. She appears well-developed and well-nourished.  Neurological: She is alert and oriented to person, place, and time.  Skin: Skin is warm and dry.   Pulmonary/Chest: Effort normal and breath sounds normal.       Assessment    Lipoma left anterior abdominal wall, left flank.    Plan    The areas were prepped with alcohol followed by 20 mL of 0.5% Xylocaine with 0.25% Marcaine with 1-200,000 epinephrine. This was supplemented with 10 mL of 1% plain Xylocaine during the procedure. ChloraPrep was applied to the skin.    The larger lesion was approached first. A skin line incision was made and carried at the skin and subcutis tissue. Superficial fascia was opened. A multilobulated mass approximately  4 cm in diameter extending down to the external oblique muscle was noted. This was excised using blunt and sharp dissection. Additional local anesthetic was infiltrated where the vessel  entered the lesion. Hemostasis was with 3-0 Vicryl ties. The deep fascia was approximated with interrupted 3-0 Vicryl sutures. The skin was closed with a running 4-0 Vicryl subcuticular suture.  Attention was turned to the smaller lesion. A transverse incision was made over the lesion and the jellybean size soft tissue mass extracted. This wound was closed with a running 4-0 Vicryl septic suture.  Benzoin, Steri-Strips, Telfa and Tegaderm dressing were applied to both lesions. Icepack provided.  Postoperative wound care instructions reviewed. Tylenol/Advil/Aleve: If needed for pain.  The patient will call if she has any concerns regarding wound healing. She will be contacted when pathology results are available.  PCP:  Deborra Medina  This has been scribed by Lesly Rubenstein LPN   Robert Bellow 10/12/2015, 2:53 PM

## 2015-10-09 NOTE — Patient Instructions (Addendum)
Keep areas clean and dry. You may shower. You may remove the waterproof dressings in 3 days, leave the steri strips in place they will fall off on their own. We will call with your results. Use ice off and on today for comfort. Tylenol or Motrin for pain.

## 2015-10-12 ENCOUNTER — Telehealth: Payer: Self-pay

## 2015-10-12 NOTE — Telephone Encounter (Signed)
Notified patient as instructed, patient pleased. Discussed follow-up appointments, patient agrees  

## 2015-10-12 NOTE — Telephone Encounter (Signed)
-----   Message from Robert Bellow, MD sent at 10/12/2015  2:55 PM EDT ----- Please notify the patient that both lesions removed were fatty growths as expected. She'll follow-up as needed. Call if any questions. ----- Message -----    From: Lab in [place]: 10/11/2015   6:39 PM      To: Robert Bellow, MD

## 2015-11-06 ENCOUNTER — Telehealth: Payer: Self-pay | Admitting: *Deleted

## 2015-11-06 NOTE — Telephone Encounter (Signed)
Patient contacted today and confirms that she is on the same medication as last office visit with the exception that she is not taking Norco. This prescription was provided to the patient after office excision but patient states she is not using. Medication list updated accordingly.   Also, she confirms that she has Miralax prescription.   We will proceed as scheduled with colonoscopy on 11-06-15 at [facility].   This patient was instructed to call the office should she have further questions.

## 2015-11-07 ENCOUNTER — Encounter: Payer: Self-pay | Admitting: *Deleted

## 2015-11-08 ENCOUNTER — Ambulatory Visit
Admission: RE | Admit: 2015-11-08 | Discharge: 2015-11-08 | Disposition: A | Discharge status: Home / Self Care | Payer: BLUE CROSS/BLUE SHIELD | Source: Ambulatory Visit | Attending: General Surgery | Admitting: General Surgery

## 2015-11-08 ENCOUNTER — Ambulatory Visit: Payer: BLUE CROSS/BLUE SHIELD | Admitting: Anesthesiology

## 2015-11-08 ENCOUNTER — Encounter: Payer: Self-pay | Admitting: *Deleted

## 2015-11-08 ENCOUNTER — Encounter
Admission: RE | Disposition: A | Discharge status: Home / Self Care | Payer: Self-pay | Source: Ambulatory Visit | Attending: General Surgery

## 2015-11-08 DIAGNOSIS — K5790 Diverticulosis of intestine, part unspecified, without perforation or abscess without bleeding: Secondary | ICD-10-CM | POA: Diagnosis not present

## 2015-11-08 DIAGNOSIS — Z880 Allergy status to penicillin: Secondary | ICD-10-CM | POA: Insufficient documentation

## 2015-11-08 DIAGNOSIS — Z1211 Encounter for screening for malignant neoplasm of colon: Secondary | ICD-10-CM | POA: Diagnosis not present

## 2015-11-08 DIAGNOSIS — Z79899 Other long term (current) drug therapy: Secondary | ICD-10-CM | POA: Insufficient documentation

## 2015-11-08 DIAGNOSIS — K5730 Diverticulosis of large intestine without perforation or abscess without bleeding: Secondary | ICD-10-CM | POA: Diagnosis not present

## 2015-11-08 HISTORY — PX: COLONOSCOPY WITH PROPOFOL: SHX5780

## 2015-11-08 LAB — POCT PREGNANCY, URINE: PREG TEST UR: NEGATIVE

## 2015-11-08 SURGERY — COLONOSCOPY WITH PROPOFOL
Anesthesia: General

## 2015-11-08 MED ORDER — PROPOFOL 10 MG/ML IV BOLUS
INTRAVENOUS | Status: DC | PRN
  Administered 2015-11-08: 100 mg via INTRAVENOUS

## 2015-11-08 MED ORDER — MIDAZOLAM HCL 5 MG/5ML IJ SOLN
INTRAMUSCULAR | Status: DC | PRN
Start: 2015-11-08 — End: 2015-11-08
  Administered 2015-11-08: 1 mg via INTRAVENOUS

## 2015-11-08 MED ORDER — LIDOCAINE 2% (20 MG/ML) 5 ML SYRINGE
INTRAMUSCULAR | Status: DC | PRN
  Administered 2015-11-08: 40 mg via INTRAVENOUS

## 2015-11-08 MED ORDER — PROPOFOL 500 MG/50ML IV EMUL
INTRAVENOUS | Status: DC | PRN
  Administered 2015-11-08: 200 ug/kg/min via INTRAVENOUS

## 2015-11-08 MED ORDER — SODIUM CHLORIDE 0.9 % IV SOLN
INTRAVENOUS | Status: DC
  Administered 2015-11-08: 12:00:00 via INTRAVENOUS

## 2015-11-08 MED ORDER — FENTANYL CITRATE (PF) 100 MCG/2ML IJ SOLN
INTRAMUSCULAR | Status: DC | PRN
  Administered 2015-11-08: 50 ug via INTRAVENOUS

## 2015-11-08 NOTE — H&P (Signed)
JANAT SAUNDERSON [id number] 08/01/1965     HPI: Candidate for screening colonoscopy. Reports tolerating the prep well. Had a lower GI "bug" three weeks ago. Now without symptoms.  Still with some soreness at lipoma excision site along the left lower rib cage.   Prescriptions prior to admission  Medication Sig Dispense Refill Last Dose  . Cholecalciferol (VITAMIN D3) 1000 units CAPS Take 1 capsule by mouth daily.   Past Week at Unknown time  . ibuprofen (ADVIL,MOTRIN) 600 MG tablet Take 1 tablet by mouth as needed.   Past Week at Unknown time  . polyethylene glycol powder (GLYCOLAX/MIRALAX) powder 255 grams one bottle for colonoscopy prep 255 g 0 11/07/2015 at Unknown time  . spironolactone (ALDACTONE) 25 MG tablet Take 1 tablet (25 mg total) by mouth daily. As needed for fluid retention 30 tablet 3 Taking   Allergies  Allergen Reactions  . Penicillins Rash   Past Medical History  Diagnosis Date  . Chicken pox   . Allergy   . Migraines    Past Surgical History  Procedure Laterality Date  . Tonsillectomy  1973  . 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 Breast biopsy Left 2014    stereo for calcification, neg  . Breast excisional biopsy Left 09/28/2007    neg   Social History   Social History  . Marital Status: Married    Spouse Name: N/A  . Number of Children: N/A  . Years of Education: N/A   Occupational History  . Not on file.   Social History Main Topics  . Smoking status: Never Smoker   . Smokeless tobacco: Never Used  . Alcohol Use: 1.2 oz/week    1 Glasses of wine, 1 Shots of liquor per week     Comment: occasionally  . Drug Use: No  . Sexual Activity: Yes   Other Topics Concern  . Not on file   Social History Narrative            Social History   Social History Narrative              ROS: Negative.     PE: HEENT: Negative. Lungs: Clear. Cardio: RR. Assessment/Plan:  Proceed with planned endoscopy.   Robert Bellow 11/08/2015

## 2015-11-08 NOTE — Transfer of Care (Signed)
Immediate Anesthesia Transfer of Care Note  Patient: Alice Spencer  Procedure(s) Performed: Procedure(s): COLONOSCOPY WITH PROPOFOL (N/A)  Patient Location: PACU and Endoscopy Unit  Anesthesia Type:General  Level of Consciousness: awake, oriented and patient cooperative  Airway & Oxygen Therapy: Patient Spontanous Breathing and Patient connected to nasal cannula oxygen  Post-op Assessment: Report given to RN and Post -op Vital signs reviewed and stable  Post vital signs: Reviewed and stable  Last Vitals:  Filed Vitals:   11/08/15 1151  BP: 130/74  Pulse: 73  Temp: 36.7 C  Resp: 14    Last Pain: There were no vitals filed for this visit.       Complications: No apparent anesthesia complications

## 2015-11-08 NOTE — Op Note (Signed)
[facility] Gastroenterology Patient Name: Alice Spencer Procedure Date: 11/08/2015 1:11 PM MRN: [id number] Account #: [id number] Date of Birth: [birth year] Admit Type: Outpatient Age: 50 Room: [facility] ENDO ROOM 1 Gender: Female Note Status: Finalized Procedure:            Colonoscopy Indications:          Screening for colorectal malignant neoplasm Providers:            Robert Bellow, MD Referring MD:         Deborra Medina, MD (Referring MD) Medicines:            Monitored Anesthesia Spencer Complications:        No immediate complications. Procedure:            Pre-Anesthesia Assessment:                       - Prior to the procedure, a History and Physical was                        performed, and patient medications, allergies and                        sensitivities were reviewed. The patient's tolerance of                        previous anesthesia was reviewed.                       - The risks and benefits of the procedure and the                        sedation options and risks were discussed with the                        patient. All questions were answered and informed                        consent was obtained.                       After obtaining informed consent, the colonoscope was                        passed under direct vision. Throughout the procedure,                        the patient's blood pressure, pulse, and oxygen                        saturations were monitored continuously. The                        Colonoscope was introduced through the anus and                        advanced to the the terminal ileum. The colonoscopy was                        performed without difficulty. The patient tolerated the  procedure well. The quality of the bowel preparation                        was excellent. Findings:      A few medium-mouthed diverticula were found in the sigmoid colon.      The retroflexed view of the distal  rectum and anal verge was normal and       showed no anal or rectal abnormalities. Impression:           - Diverticulosis in the sigmoid colon.                       - The distal rectum and anal verge are normal on                        retroflexion view.                       - No specimens collected. Recommendation:       - Repeat colonoscopy in 10 years for screening purposes.                       - High fiber diet indefinitely. Procedure Code(s):    --- Professional ---                       [id number], Colonoscopy, flexible; diagnostic, including                        collection of specimen(s) by brushing or washing, when                        performed (separate procedure) Diagnosis Code(s):    --- Professional ---                       Z12.11, Encounter for screening for malignant neoplasm                        of colon                       K57.30, Diverticulosis of large intestine without                        perforation or abscess without bleeding CPT copyright 2016 American Medical Association. All rights reserved. The codes documented in this report are preliminary and upon coder review may  be revised to meet current compliance requirements. Robert Bellow, MD 11/08/2015 1:31:41 PM This report has been signed electronically. Number of Addenda: 0 Note Initiated On: 11/08/2015 1:11 PM Scope Withdrawal Time: 0 hours 6 minutes 30 seconds  Total Procedure Duration: 0 hours 13 minutes 10 seconds       [facility]

## 2015-11-08 NOTE — Anesthesia Preprocedure Evaluation (Signed)
Anesthesia Evaluation  Patient identified by MRN, date of birth, ID band Patient awake    Reviewed: Allergy & Precautions, NPO status , Patient's Chart, lab work & pertinent test results, reviewed documented beta blocker date and time   Airway Mallampati: II  TM Distance: >3 FB     Dental  (+) Chipped   Pulmonary shortness of breath,           Cardiovascular      Neuro/Psych  Headaches,    GI/Hepatic   Endo/Other    Renal/GU      Musculoskeletal   Abdominal   Peds  Hematology   Anesthesia Other Findings   Reproductive/Obstetrics                             Anesthesia Physical Anesthesia Plan  ASA: II  Anesthesia Plan: General   Post-op Pain Management:    Induction: Intravenous  Airway Management Planned: Nasal Cannula  Additional Equipment:   Intra-op Plan:   Post-operative Plan:   Informed Consent: I have reviewed the patients History and Physical, chart, labs and discussed the procedure including the risks, benefits and alternatives for the proposed anesthesia with the patient or authorized representative who has indicated his/her understanding and acceptance.     Plan Discussed with: CRNA  Anesthesia Plan Comments:         Anesthesia Quick Evaluation

## 2015-11-09 ENCOUNTER — Encounter: Payer: Self-pay | Admitting: General Surgery

## 2015-11-09 NOTE — Anesthesia Postprocedure Evaluation (Signed)
Anesthesia Post Note  Patient: Alice Spencer  Procedure(s) Performed: Procedure(s) (LRB): COLONOSCOPY WITH PROPOFOL (N/A)  Patient location during evaluation: Endoscopy Anesthesia Type: General Level of consciousness: awake and alert Pain management: pain level controlled Vital Signs Assessment: post-procedure vital signs reviewed and stable Respiratory status: spontaneous breathing, nonlabored ventilation, respiratory function stable and patient connected to nasal cannula oxygen Cardiovascular status: blood pressure returned to baseline and stable Postop Assessment: no signs of nausea or vomiting Anesthetic complications: no    Last Vitals:  Filed Vitals:   11/08/15 1330 11/08/15 1336  BP: 120/82 120/82  Pulse: 93 94  Temp: 36.1 C 36.1 C  Resp: 18 14    Last Pain: There were no vitals filed for this visit.               Martha Clan

## 2015-12-28 ENCOUNTER — Ambulatory Visit (INDEPENDENT_AMBULATORY_CARE_PROVIDER_SITE_OTHER): Payer: BLUE CROSS/BLUE SHIELD

## 2015-12-28 ENCOUNTER — Encounter: Payer: Self-pay | Admitting: Internal Medicine

## 2015-12-28 ENCOUNTER — Ambulatory Visit: Payer: Self-pay | Admitting: Internal Medicine

## 2015-12-28 ENCOUNTER — Ambulatory Visit (INDEPENDENT_AMBULATORY_CARE_PROVIDER_SITE_OTHER): Payer: BLUE CROSS/BLUE SHIELD | Admitting: Internal Medicine

## 2015-12-28 VITALS — BP 120/70 | HR 80 | Temp 97.9°F | Resp 10 | Ht 68.0 in | Wt 190.5 lb

## 2015-12-28 DIAGNOSIS — M25571 Pain in right ankle and joints of right foot: Secondary | ICD-10-CM

## 2015-12-28 DIAGNOSIS — M6588 Other synovitis and tenosynovitis, other site: Secondary | ICD-10-CM

## 2015-12-28 DIAGNOSIS — M7751 Other enthesopathy of right foot: Secondary | ICD-10-CM

## 2015-12-28 DIAGNOSIS — M7731 Calcaneal spur, right foot: Secondary | ICD-10-CM | POA: Diagnosis not present

## 2015-12-28 MED ORDER — MELOXICAM 15 MG PO TABS
15.0000 mg | ORAL_TABLET | Freq: Every day | ORAL | Status: DC
Start: 2015-12-28 — End: 2017-01-13

## 2015-12-28 NOTE — Progress Notes (Signed)
Subjective:  Patient ID: Alice Spencer, female    DOB: 28-Jun-1965  Age: 50 y.o. MRN: [id number]  CC: The primary encounter diagnosis was Pain in joint, ankle and foot, right. A diagnosis of Tendonitis of ankle, right was also pertinent to this visit.  HPI Alice Spencer presents for right lateral foot pain .  The pain has been present for 2 weeks and was not the result of a fall or dropping anything on her foot.   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  The pain is described as dull and throbbing and was initially associated with mild swelling of the mid foot but no bruising.  The pain now starts at the  Ankle.  It is worse with weight bearing    Outpatient Prescriptions Prior to Visit  Medication Sig Dispense Refill  . Cholecalciferol (VITAMIN D3) 1000 units CAPS Take 1 capsule by mouth daily.    Marland Kitchen ibuprofen (ADVIL,MOTRIN) 600 MG tablet Take 1 tablet by mouth as needed.    Marland Kitchen spironolactone (ALDACTONE) 25 MG tablet Take 1 tablet (25 mg total) by mouth daily. As needed for fluid retention 30 tablet 3  . polyethylene glycol powder (GLYCOLAX/MIRALAX) powder 255 grams one bottle for colonoscopy prep 255 g 0   No facility-administered medications prior to visit.    Review of Systems;  Patient denies headache, fevers, malaise, unintentional weight loss, skin rash, eye pain, sinus congestion and sinus pain, sore throat, dysphagia,  hemoptysis , cough, dyspnea, wheezing, chest pain, palpitations, orthopnea, edema, abdominal pain, nausea, melena, diarrhea, constipation, flank pain, dysuria, hematuria, urinary  Frequency, nocturia, numbness, tingling, seizures,  Focal weakness, Loss of consciousness,  Tremor, insomnia, depression, anxiety, and suicidal ideation.      Objective:  BP 120/70 mmHg  Pulse 80  Temp(Src) 97.9 F (36.6 C) (Oral)  Resp 10  Ht 5\' 8"  (1.727 m)  Wt 190 lb 8 oz (86.41 kg)  BMI 28.97 kg/m2  SpO2 97%  LMP 12/19/2015 (Approximate)  BP  Readings from Last 3 Encounters:  12/28/15 120/70  11/08/15 120/82  10/09/15 140/78    Wt Readings from Last 3 Encounters:  12/28/15 190 lb 8 oz (86.41 kg)  11/08/15 178 lb (80.74 kg)  10/09/15 178 lb (80.74 kg)    General appearance: alert, cooperative and appears stated age  Back: symmetric, no curvature. ROM normal. No CVA tenderness. Lungs: clear to auscultation bilaterally Heart: regular rate and rhythm, S1, S2 normal, no murmur, click, rub or gallop Abdomen: soft, non-tender; bowel sounds normal; no masses,  no organomegaly Pulses: 2+ and symmetric Skin: Skin color, texture, turgor normal. No rashes or lesions Lymph nodes: Cervical, supraclavicular, and axillary nodes normal. MSK: right foot without bruising or swelling.  No MT pain no pain with eversion or inversion   No results found for: HGBA1C  Lab Results  Component Value Date   CREATININE 1.2 04/26/2014   CREATININE 0.8 09/04/2012    Lab Results  Component Value Date   WBC 6.9 04/26/2014   HGB 13.0 04/26/2014   HCT 39.8 04/26/2014   PLT 303.0 04/26/2014   GLUCOSE 93 04/26/2014   CHOL 176 09/04/2012   TRIG 35.0 09/04/2012   HDL 51.30 09/04/2012   LDLCALC 118* 09/04/2012   ALT 21 04/26/2014   AST 22 04/26/2014   NA 137 04/26/2014   K 3.9 04/26/2014   CL 99 04/26/2014   CREATININE 1.2 04/26/2014   BUN 18 04/26/2014  CO2 33* 04/26/2014   TSH 1.91 04/26/2014    Mm Screening Breast Tomo Bilateral  10/10/2015  CLINICAL DATA:  Screening. EXAM: 2D DIGITAL SCREENING BILATERAL MAMMOGRAM WITH CAD AND ADJUNCT TOMO COMPARISON:  Previous exam(s). ACR Breast Density Category c: The breast tissue is heterogeneously dense, which may obscure small masses. FINDINGS: There are no findings suspicious for malignancy. Images were processed with CAD. IMPRESSION: No mammographic evidence of malignancy. A result letter of this screening mammogram will be mailed directly to the patient. RECOMMENDATION: Screening mammogram in  one year. (Code:SM-B-01Y) BI-RADS CATEGORY  1: Negative. Electronically Signed   By: Claudie Revering M.D.   On: 10/10/2015 09:47    Assessment & Plan:   Problem List Items Addressed This Visit    Tendonitis of ankle, right    Fractures ruled out by plain films.  NSAIDs,  Ice and activity modification for 2 weeks.  If no improvement,  Refer to podiatry        Other Visit Diagnoses    Pain in joint, ankle and foot, right    -  Primary    Relevant Orders    DG Foot Complete Right (Completed)       I have discontinued Ms. Hudon's polyethylene glycol powder. I am also having her start on meloxicam. Additionally, I am having her maintain her ibuprofen, Vitamin D3, and spironolactone.  Meds ordered this encounter  Medications  . meloxicam (MOBIC) 15 MG tablet    Sig: Take 1 tablet (15 mg total) by mouth daily.    Dispense:  30 tablet    Refill:  2    Medications Discontinued During This Encounter  Medication Reason  . polyethylene glycol powder (GLYCOLAX/MIRALAX) powder Completed Course    Follow-up: No Follow-up on file.   Crecencio Mc, MD

## 2015-12-28 NOTE — Progress Notes (Signed)
Pre-visit discussion using our clinic review tool. No additional management support is needed unless otherwise documented below in the visit note.  

## 2015-12-28 NOTE — Patient Instructions (Signed)
I am treating you for tendonitis,  But if the x rays show a fracture I'll make a referral to Dr Milinda Pointer for a hard sole shoe to wear  Take the meloxicam daily with food.  Add tylenol up to 2000 mg daily but no Aleve or Motrin   Ice for 15 minutes several times daily  No jump squats for 2 weeks

## 2015-12-29 ENCOUNTER — Encounter: Payer: Self-pay | Admitting: Internal Medicine

## 2015-12-30 DIAGNOSIS — M7751 Other enthesopathy of right foot: Secondary | ICD-10-CM | POA: Insufficient documentation

## 2015-12-30 NOTE — Assessment & Plan Note (Signed)
Fractures ruled out by plain films.  NSAIDs,  Ice and activity modification for 2 weeks.  If no improvement,  Refer to podiatry

## 2016-01-05 ENCOUNTER — Ambulatory Visit: Payer: BLUE CROSS/BLUE SHIELD | Admitting: Sports Medicine

## 2016-01-12 NOTE — Telephone Encounter (Signed)
Mailed unread message to patient.  

## 2016-04-04 DIAGNOSIS — Z23 Encounter for immunization: Secondary | ICD-10-CM | POA: Diagnosis not present

## 2016-04-24 DIAGNOSIS — B351 Tinea unguium: Secondary | ICD-10-CM | POA: Diagnosis not present

## 2016-04-24 DIAGNOSIS — L814 Other melanin hyperpigmentation: Secondary | ICD-10-CM | POA: Diagnosis not present

## 2016-04-24 DIAGNOSIS — L821 Other seborrheic keratosis: Secondary | ICD-10-CM | POA: Diagnosis not present

## 2016-04-24 DIAGNOSIS — L728 Other follicular cysts of the skin and subcutaneous tissue: Secondary | ICD-10-CM | POA: Diagnosis not present

## 2016-06-27 DIAGNOSIS — H5213 Myopia, bilateral: Secondary | ICD-10-CM | POA: Diagnosis not present

## 2016-07-01 ENCOUNTER — Ambulatory Visit (INDEPENDENT_AMBULATORY_CARE_PROVIDER_SITE_OTHER): Payer: BLUE CROSS/BLUE SHIELD | Admitting: Family Medicine

## 2016-07-01 ENCOUNTER — Encounter: Payer: Self-pay | Admitting: Family Medicine

## 2016-07-01 DIAGNOSIS — M25561 Pain in right knee: Secondary | ICD-10-CM | POA: Insufficient documentation

## 2016-07-01 NOTE — Assessment & Plan Note (Signed)
New acute problem. Exam unremarkable. No evidence of ligamentous or meniscal injury at this time. Advised compression and PRN Ibuprofen. If persist will arrange SM referral for [place].

## 2016-07-01 NOTE — Progress Notes (Signed)
Pre visit review using our clinic review tool, if applicable. No additional management support is needed unless otherwise documented below in the visit note. 

## 2016-07-01 NOTE — Patient Instructions (Signed)
Compression and NSAID as needed.  Slow re-introduction back to normal activities.  If persists, I will arrange [place].  Take care  Dr. Lacinda Axon

## 2016-07-01 NOTE — Progress Notes (Signed)
   Subjective:  Patient ID: Alice Spencer, female    DOB: 06/20/1966  Age: 51 y.o. MRN: [id number]  CC: Right knee pain  HPI:  51 year old female presents with the above complaint.  Right knee pain  Started Friday.  Heard a "pop" during work out (was doing squats and lunges; has a Clinical research associate).  Pain is mostly laterally. Some posteriorly as well.  Mild pain, 4/10 in severity.  Has had some swelling.  Worse with going up and down stairs.  No known relieving factors.  No other complaints or concerns at this time.  Social Hx   Social History   Social History  . Marital status: Married    Spouse name: N/A  . Number of children: N/A  . Years of education: N/A   Social History Main Topics  . Smoking status: Never Smoker  . Smokeless tobacco: Never Used  . Alcohol use 1.2 oz/week    1 Glasses of wine, 1 Shots of liquor per week     Comment: occasionally  . Drug use: No  . Sexual activity: Yes   Other Topics Concern  . None   Social History Narrative            Review of Systems  Constitutional: Negative.   Musculoskeletal:       Right knee pain.   Objective:  BP 118/73   Pulse 71   Temp 98.7 F (37.1 C) (Other (Comment))   Resp 12   Wt 174 lb (78.9 kg)   SpO2 100%   BMI 26.46 kg/m   BP/Weight 07/01/2016 12/28/2015 0000000  Systolic BP 123456 123456 123456  Diastolic BP 73 70 82  Wt. (Lbs) 174 190.5 178  BMI 26.46 28.97 27.07   Physical Exam  Constitutional: She is oriented to person, place, and time. She appears well-developed. No distress.  Pulmonary/Chest: Effort normal.  Musculoskeletal:  Knee: Right Normal to inspection with no erythema or effusion or obvious bony abnormalities.  Palpation normal with no warmth, joint line tenderness, patellar tenderness, or condyle tenderness. ROM full in flexion and extension and lower leg rotation. Ligaments with solid consistent endpoints including ACL, PCL, LCL, MCL. Negative Mcmurray's, Apley's, and Thessalonian  tests. Mild pain with patellar compression. Patellar glide without crepitus. Patellar and quadriceps tendons unremarkable.    Neurological: She is alert and oriented to person, place, and time.  Psychiatric: She has a normal mood and affect.  Vitals reviewed.  Lab Results  Component Value Date   WBC 6.9 04/26/2014   HGB 13.0 04/26/2014   HCT 39.8 04/26/2014   PLT 303.0 04/26/2014   GLUCOSE 93 04/26/2014   CHOL 176 09/04/2012   TRIG 35.0 09/04/2012   HDL 51.30 09/04/2012   LDLCALC 118 (H) 09/04/2012   ALT 21 04/26/2014   AST 22 04/26/2014   NA 137 04/26/2014   K 3.9 04/26/2014   CL 99 04/26/2014   CREATININE 1.2 04/26/2014   BUN 18 04/26/2014   CO2 33 (H) 04/26/2014   TSH 1.91 04/26/2014   Assessment & Plan:   Problem List Items Addressed This Visit    Right knee pain    New acute problem. Exam unremarkable. No evidence of ligamentous or meniscal injury at this time. Advised compression and PRN Ibuprofen. If persist will arrange SM referral for [place].        Follow-up: PRN  [place]

## 2016-07-26 DIAGNOSIS — H3561 Retinal hemorrhage, right eye: Secondary | ICD-10-CM | POA: Diagnosis not present

## 2016-10-01 DIAGNOSIS — H3561 Retinal hemorrhage, right eye: Secondary | ICD-10-CM | POA: Diagnosis not present

## 2016-10-29 DIAGNOSIS — H43811 Vitreous degeneration, right eye: Secondary | ICD-10-CM | POA: Insufficient documentation

## 2016-10-29 DIAGNOSIS — H3561 Retinal hemorrhage, right eye: Secondary | ICD-10-CM

## 2016-10-29 DIAGNOSIS — H5213 Myopia, bilateral: Secondary | ICD-10-CM | POA: Diagnosis not present

## 2016-10-29 DIAGNOSIS — H33011 Retinal detachment with single break, right eye: Secondary | ICD-10-CM | POA: Diagnosis not present

## 2016-10-29 HISTORY — DX: Retinal hemorrhage, right eye: H35.61

## 2016-10-29 HISTORY — DX: Vitreous degeneration, right eye: H43.811

## 2016-10-30 DIAGNOSIS — H43811 Vitreous degeneration, right eye: Secondary | ICD-10-CM | POA: Diagnosis not present

## 2016-10-30 DIAGNOSIS — H3321 Serous retinal detachment, right eye: Secondary | ICD-10-CM | POA: Diagnosis not present

## 2016-10-30 DIAGNOSIS — M19041 Primary osteoarthritis, right hand: Secondary | ICD-10-CM | POA: Diagnosis not present

## 2016-10-30 DIAGNOSIS — M19042 Primary osteoarthritis, left hand: Secondary | ICD-10-CM | POA: Diagnosis not present

## 2016-10-30 DIAGNOSIS — H3561 Retinal hemorrhage, right eye: Secondary | ICD-10-CM | POA: Diagnosis not present

## 2016-10-30 DIAGNOSIS — H33001 Unspecified retinal detachment with retinal break, right eye: Secondary | ICD-10-CM | POA: Diagnosis not present

## 2016-10-30 DIAGNOSIS — Z9889 Other specified postprocedural states: Secondary | ICD-10-CM | POA: Diagnosis not present

## 2016-10-31 DIAGNOSIS — H33011 Retinal detachment with single break, right eye: Secondary | ICD-10-CM | POA: Insufficient documentation

## 2016-10-31 HISTORY — DX: Retinal detachment with single break, right eye: H33.011

## 2016-11-07 DIAGNOSIS — H33011 Retinal detachment with single break, right eye: Secondary | ICD-10-CM | POA: Diagnosis not present

## 2016-11-11 DIAGNOSIS — H33011 Retinal detachment with single break, right eye: Secondary | ICD-10-CM | POA: Diagnosis not present

## 2016-11-15 DIAGNOSIS — H33011 Retinal detachment with single break, right eye: Secondary | ICD-10-CM | POA: Diagnosis not present

## 2016-11-29 DIAGNOSIS — H33011 Retinal detachment with single break, right eye: Secondary | ICD-10-CM | POA: Diagnosis not present

## 2016-12-30 DIAGNOSIS — H3561 Retinal hemorrhage, right eye: Secondary | ICD-10-CM | POA: Diagnosis not present

## 2016-12-30 DIAGNOSIS — H43811 Vitreous degeneration, right eye: Secondary | ICD-10-CM | POA: Diagnosis not present

## 2016-12-30 DIAGNOSIS — H33011 Retinal detachment with single break, right eye: Secondary | ICD-10-CM | POA: Diagnosis not present

## 2017-01-13 ENCOUNTER — Ambulatory Visit (INDEPENDENT_AMBULATORY_CARE_PROVIDER_SITE_OTHER): Payer: BLUE CROSS/BLUE SHIELD | Admitting: Podiatry

## 2017-01-13 ENCOUNTER — Encounter: Payer: Self-pay | Admitting: Podiatry

## 2017-01-13 ENCOUNTER — Ambulatory Visit (INDEPENDENT_AMBULATORY_CARE_PROVIDER_SITE_OTHER): Payer: BLUE CROSS/BLUE SHIELD

## 2017-01-13 ENCOUNTER — Telehealth: Payer: Self-pay | Admitting: Podiatry

## 2017-01-13 DIAGNOSIS — M722 Plantar fascial fibromatosis: Secondary | ICD-10-CM

## 2017-01-13 MED ORDER — METHYLPREDNISOLONE 4 MG PO TBPK: ORAL_TABLET | ORAL | 0 refills | Status: DC

## 2017-01-13 MED ORDER — MELOXICAM 15 MG PO TABS: 15.0000 mg | ORAL_TABLET | Freq: Every day | ORAL | 3 refills | Status: DC

## 2017-01-13 NOTE — Progress Notes (Signed)
She presents with a sharp heel pain 2 months left foot. Mornings are bad she's noticed more recently the lateral aspect of the left foot started painful as well she's tried soaking and Tylenol no nothing really seems to take the edge off.  Objective: Vital signs are stable she is alert and oriented 3. Pulses are palpable. Neurologic sensorium is intact per Semmes-Weinstein monofilament. Deep tendon reflexes are intact. Muscle strength is 5 over 5 dorsiflexion plantar flexors and inverters everters all intrinsic musculature is intact. She has pain on palpation medial calcaneal tubercle of the left heel. Otherwise all joints distal to the ankle for range of motion without crepitation. Radiographic evaluation demonstrates an osseous limit to her individual with soft tissue increase in density at the plantar fascial calcaneal insertion site. Assessment plantar fasciitis left.  Plan: Start her on a Medrol Dosepak injected the left heel today with Kenalog and local anesthetic she will follow they Medrol with meloxicam. Posterior and plantar fascial brace and a night splint. Discussed appropriate shoe gear stretching exercises ice therapy and shoewear modifications. Follow up with Emilya in 1 month.

## 2017-01-13 NOTE — Patient Instructions (Signed)

## 2017-01-13 NOTE — Telephone Encounter (Signed)
Yes I was there first thing this morning and Dr. Milinda Pointer was to send an Rx to [facility]. I went there during lunch 3 hours later and they have no record of anything being sent. I need to start the steroids today. If you could call and let me know what [facility] it was sent to or if it was sent at all.

## 2017-01-14 NOTE — Telephone Encounter (Signed)
Returned call to patient, she was able to pick up her medication yesterday

## 2017-02-10 DIAGNOSIS — J029 Acute pharyngitis, unspecified: Secondary | ICD-10-CM | POA: Diagnosis not present

## 2017-02-10 DIAGNOSIS — H66003 Acute suppurative otitis media without spontaneous rupture of ear drum, bilateral: Secondary | ICD-10-CM | POA: Diagnosis not present

## 2017-02-17 ENCOUNTER — Ambulatory Visit (INDEPENDENT_AMBULATORY_CARE_PROVIDER_SITE_OTHER): Payer: BLUE CROSS/BLUE SHIELD | Admitting: Podiatry

## 2017-02-17 ENCOUNTER — Encounter: Payer: Self-pay | Admitting: Podiatry

## 2017-02-17 DIAGNOSIS — M722 Plantar fascial fibromatosis: Secondary | ICD-10-CM | POA: Diagnosis not present

## 2017-02-17 DIAGNOSIS — H33011 Retinal detachment with single break, right eye: Secondary | ICD-10-CM | POA: Diagnosis not present

## 2017-02-17 DIAGNOSIS — H43811 Vitreous degeneration, right eye: Secondary | ICD-10-CM | POA: Diagnosis not present

## 2017-02-17 NOTE — Progress Notes (Signed)
She presents today for follow-up of her left heel pain. She states is about 50% improved. She states this definitely not like it was.  Objective: Vital signs are stable she is alert and oriented 3. Pulses are palpable. She has pain on palpation medially continue tubercle of the left heel.  Assessment: Plantar fasciitis resolved by 50%.  Plan: We injected the left heel today with a local anesthetic and Kenalog. She will continue all other conservative therapies and I will follow-up with her in 1 month.Marland Kitchen

## 2017-03-12 ENCOUNTER — Ambulatory Visit: Payer: BLUE CROSS/BLUE SHIELD | Admitting: Podiatry

## 2017-03-17 ENCOUNTER — Ambulatory Visit (INDEPENDENT_AMBULATORY_CARE_PROVIDER_SITE_OTHER): Payer: BLUE CROSS/BLUE SHIELD | Admitting: Podiatry

## 2017-03-17 ENCOUNTER — Encounter: Payer: Self-pay | Admitting: Podiatry

## 2017-03-17 DIAGNOSIS — M722 Plantar fascial fibromatosis: Secondary | ICD-10-CM | POA: Diagnosis not present

## 2017-03-17 NOTE — Progress Notes (Signed)
She presents today with chief complaint of plantar fasciitis. States that is resolved by about 70-80% this point. She refers the left foot. No calf pain she states she denies any chest pain. States that she has not been taking the meloxicam regularly however she has been wearing her brace splints regularly. Presenting today in sandals.  Objective: Gen. she is alert and oriented 3 pulses are palpable. Neurologic sensorium is intact degenerative flexor intact muscle strength normal. She is pain on palpation medial tubercle of the left heel but not nearly as severely as it has been passed.  Assessment: Well-healing plantar fasciitis 70-80% resolved left.  Plan: At this point I injected the left heel today for her to take her meloxicam a regular basis. We'll follow up with her in 1 month.

## 2017-04-16 ENCOUNTER — Ambulatory Visit: Payer: BLUE CROSS/BLUE SHIELD | Admitting: Podiatry

## 2017-04-26 DIAGNOSIS — Z23 Encounter for immunization: Secondary | ICD-10-CM | POA: Diagnosis not present

## 2017-04-28 DIAGNOSIS — D485 Neoplasm of uncertain behavior of skin: Secondary | ICD-10-CM | POA: Diagnosis not present

## 2017-04-28 DIAGNOSIS — L578 Other skin changes due to chronic exposure to nonionizing radiation: Secondary | ICD-10-CM | POA: Diagnosis not present

## 2017-04-28 DIAGNOSIS — Z86018 Personal history of other benign neoplasm: Secondary | ICD-10-CM | POA: Diagnosis not present

## 2017-05-05 DIAGNOSIS — D485 Neoplasm of uncertain behavior of skin: Secondary | ICD-10-CM | POA: Diagnosis not present

## 2017-05-12 DIAGNOSIS — D0361 Melanoma in situ of right upper limb, including shoulder: Secondary | ICD-10-CM | POA: Diagnosis not present

## 2017-05-12 DIAGNOSIS — D485 Neoplasm of uncertain behavior of skin: Secondary | ICD-10-CM | POA: Diagnosis not present

## 2017-05-14 DIAGNOSIS — L03119 Cellulitis of unspecified part of limb: Secondary | ICD-10-CM | POA: Diagnosis not present

## 2017-05-30 DIAGNOSIS — H35371 Puckering of macula, right eye: Secondary | ICD-10-CM | POA: Diagnosis not present

## 2017-05-30 DIAGNOSIS — H33011 Retinal detachment with single break, right eye: Secondary | ICD-10-CM | POA: Diagnosis not present

## 2017-08-06 ENCOUNTER — Ambulatory Visit: Payer: BLUE CROSS/BLUE SHIELD | Admitting: Internal Medicine

## 2017-08-06 ENCOUNTER — Encounter: Payer: Self-pay | Admitting: Internal Medicine

## 2017-08-06 VITALS — BP 134/72 | HR 81 | Temp 98.1°F | Resp 15 | Ht 68.0 in | Wt 193.4 lb

## 2017-08-06 DIAGNOSIS — M67441 Ganglion, right hand: Secondary | ICD-10-CM

## 2017-08-06 DIAGNOSIS — C44622 Squamous cell carcinoma of skin of right upper limb, including shoulder: Secondary | ICD-10-CM

## 2017-08-06 DIAGNOSIS — B9789 Other viral agents as the cause of diseases classified elsewhere: Secondary | ICD-10-CM | POA: Diagnosis not present

## 2017-08-06 DIAGNOSIS — J069 Acute upper respiratory infection, unspecified: Secondary | ICD-10-CM | POA: Diagnosis not present

## 2017-08-06 DIAGNOSIS — N63 Unspecified lump in unspecified breast: Secondary | ICD-10-CM | POA: Diagnosis not present

## 2017-08-06 LAB — POCT INFLUENZA A/B
INFLUENZA A, POC: NEGATIVE
INFLUENZA B, POC: NEGATIVE

## 2017-08-06 MED ORDER — GUAIFENESIN-CODEINE 100-10 MG/5ML PO SYRP: 5.0000 mL | ORAL_SOLUTION | Freq: Three times a day (TID) | ORAL | 0 refills | Status: DC | PRN

## 2017-08-06 MED ORDER — PREDNISONE 10 MG PO TABS: ORAL_TABLET | ORAL | 0 refills | Status: DC

## 2017-08-06 MED ORDER — LEVOFLOXACIN 500 MG PO TABS: 500.0000 mg | ORAL_TABLET | Freq: Every day | ORAL | 0 refills | Status: DC

## 2017-08-06 NOTE — Progress Notes (Signed)
Subjective:  Patient ID: Alice Spencer, female    DOB: May 23, 1966  Age: 52 y.o. MRN: [id number]  CC: The primary encounter diagnosis was Viral URI with cough. 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  HPI Deoni C Waren presents for evaluation of  Multiple issues.  Last seen July 2017  1) lump in right breast noted 10 days ago during self breast exam.  Feels like a bb sized mass near nipple, non tender.  . Last mammogram April 2017,  3D  Normal.   History of breast biopsy left size several years ago, benign.   2) skin cancer removed from right forearm by dr Hinda Kehr.  Requiring repeat excision for clear margins . Has a  2 inch scar healing well  3) had several days of post procedural elbow pain which was on the ulnar side and radiated to hand,  Now nearly resolved  4) developed a cyst on the dorsal surface of right hand  That is nontender. Near the ring finger CMC.  Not attached to a tendon,     5) "fever"  Of 101.1  and cold symptoms started on Sunday  Accompanied by cough and  headache.   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  Annual exams since then .      Outpatient Medications Prior to Visit  Medication Sig Dispense Refill  . meloxicam (MOBIC) 15 MG tablet Take 1 tablet (15 mg total) by mouth daily. 30 tablet 3  . spironolactone (ALDACTONE) 25 MG tablet Take 1 tablet (25 mg total) by mouth daily. As needed for fluid retention 30 tablet 3  . Cholecalciferol (VITAMIN D3) 1000 units CAPS Take 1 capsule by mouth daily.     No facility-administered medications prior to visit.     Review of Systems;  Patient denies headache, fevers, malaise, unintentional weight loss, skin rash, eye pain, sinus congestion and sinus pain, sore throat, dysphagia,  hemoptysis , cough, dyspnea, wheezing, chest pain, palpitations,  orthopnea, edema, abdominal pain, nausea, melena, diarrhea, constipation, flank pain, dysuria, hematuria, urinary  Frequency, nocturia, numbness, tingling, seizures,  Focal weakness, Loss of consciousness,  Tremor, insomnia, depression, anxiety, and suicidal ideation.      Objective:  BP 134/72 (BP Location: Right Arm, Patient Position: Sitting, Cuff Size: Large)   Pulse 81   Temp 98.1 F (36.7 C) (Oral)   Resp 15   Ht 5\' 8"  (1.727 m)   Wt 193 lb 6.4 oz (87.7 kg)   SpO2 97%   BMI 29.41 kg/m   BP Readings from Last 3 Encounters:  08/06/17 134/72  07/01/16 118/73  12/28/15 120/70    Wt Readings from Last 3 Encounters:  08/06/17 193 lb 6.4 oz (87.7 kg)  07/01/16 174 lb (78.9 kg)  12/28/15 190 lb 8 oz (86.4 kg)    General appearance: alert, cooperative and appears stated age Ears: normal TM's and external ear canals both ears Throat: lips, mucosa, and tongue normal; teeth and gums normal Neck: no adenopathy, no carotid bruit, supple, symmetrical, trachea midline and thyroid not enlarged, symmetric, no tenderness/mass/nodules Back: symmetric, no curvature. ROM normal. No CVA tenderness. Lungs: clear to auscultation bilaterally Breast:  Peppercorn sized mass at 12:00 position abutting the areola, right breast;  Similar size mass a 9:00 position abutting left areola  Heart: regular rate and  rhythm, S1, S2 normal, no murmur, click, rub or gallop Abdomen: soft, non-tender; bowel sounds normal; no masses,  no organomegaly Pulses: 2+ and symmetric Skin: Skin color, texture, turgor normal. No rashes or lesions Lymph nodes: Cervical, supraclavicular, and axillary nodes normal.  No results found for: HGBA1C  Lab Results  Component Value Date   CREATININE 1.2 04/26/2014   CREATININE 0.8 09/04/2012    Lab Results  Component Value Date   WBC 6.9 04/26/2014   HGB 13.0 04/26/2014   HCT 39.8 04/26/2014   PLT 303.0 04/26/2014   GLUCOSE 93 04/26/2014   CHOL 176 09/04/2012   TRIG  35.0 09/04/2012   HDL 51.30 09/04/2012   LDLCALC 118 (H) 09/04/2012   ALT 21 04/26/2014   AST 22 04/26/2014   NA 137 04/26/2014   K 3.9 04/26/2014   CL 99 04/26/2014   CREATININE 1.2 04/26/2014   BUN 18 04/26/2014   CO2 33 (H) 04/26/2014   TSH 1.91 04/26/2014    Mm Screening Breast Tomo Bilateral  Result Date: 10/10/2015 CLINICAL DATA:  Screening. EXAM: 2D DIGITAL SCREENING BILATERAL MAMMOGRAM WITH CAD AND ADJUNCT TOMO COMPARISON:  Previous exam(s). ACR Breast Density Category c: The breast tissue is heterogeneously dense, which may obscure small masses. FINDINGS: There are no findings suspicious for malignancy. Images were processed with CAD. IMPRESSION: No mammographic evidence of malignancy. A result letter of this screening mammogram will be mailed directly to the patient. RECOMMENDATION: Screening mammogram in one year. (Code:SM-B-01Y) BI-RADS CATEGORY  1: Negative. Electronically Signed   By: Claudie Revering M.D.   On: 10/10/2015 09:47    Assessment & Plan:   Problem List Items Addressed This Visit    Breast mass    Diagnostic mammogram ordered of both breasts.       Relevant Orders   MM Digital Diagnostic Bilat   US BREAST COMPLETE UNI LEFT INC AXILLA   US BREAST LTD UNI RIGHT INC AXILLA   Ganglion cyst of joint of finger of right hand    Suspected by exam.  Reassurance given.  No intervention unless int interferes with function       Viral URI with cough - Primary    Influenza test is negative.  Lung exam is normal,  No signs of sinusitis or otitis either.  Symptoms appear to be secondary to viral infection.  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  Scar healing well.       Relevant Medications   predniSONE (DELTASONE) 10 MG tablet   levofloxacin (LEVAQUIN) 500 MG  tablet     A total of 40 minutes was spent with patient more than half of which was spent in counseling patient on the above mentioned issues , reviewing and explaining recent labs and imaging studies done, and coordination of care.  I have discontinued Fartun C. Rozas's Vitamin D3. I am also having her start on predniSONE, guaiFENesin-codeine, and levofloxacin. Additionally, I am having her maintain her spironolactone and meloxicam.  Meds ordered this encounter  Medications  . predniSONE (DELTASONE) 10 MG tablet    Sig: 6 tablets on Day 1 , then reduce by 1 tablet daily until gone    Dispense:  21 tablet    Refill:  0  .  guaiFENesin-codeine (CHERATUSSIN AC) 100-10 MG/5ML syrup    Sig: Take 5 mLs by mouth 3 (three) times daily as needed for cough.    Dispense:  120 mL    Refill:  0  . levofloxacin (LEVAQUIN) 500 MG tablet    Sig: Take 1 tablet (500 mg total) by mouth daily.    Dispense:  7 tablet    Refill:  0    Medications Discontinued During This Encounter  Medication Reason  . Cholecalciferol (VITAMIN D3) 1000 units CAPS Patient has not taken in last 30 days    Follow-up: No Follow-up on file.   Crecencio Mc, MD

## 2017-08-06 NOTE — Patient Instructions (Signed)
You appear to have a ganglionic cyst on your right hand.  Treatment is not needed unless it becomes painful  or inhibits movement  Mammogram ordered .  You do not have the flu.  You have a viral syndrome which is causing bronchitis.    The post nasal drip may also be contributing to  your  Cough.  I am prescribing a prednisonetaper (6 days) along with a cough suppressant with codeine    I also advise use of the following OTC meds to help with your other symptoms.   Continue generic OTC benadryl 25 mg  At bedtime for the drainage,,  tessalon perles or Delsym for daytime cough. flush your sinuses twice daily with Milta Deiters Meds sinus sinse   Your coughing may last 7 to 10 days and yo u may develop a low grade fever ( T > 100.4,  les than 102) but if you develop Green nasal discharge,  Ear  Or facial pain, start the antibiotic I have given you a prescription for   If you start the antibiotic, Please take a probiotic ( Align, Floraque or Culturelle) while you are on the antibiotic to prevent a serious antibiotic associated diarrhea  Called clostridium dificile colitis and a vaginal yeast infection

## 2017-08-07 DIAGNOSIS — C44622 Squamous cell carcinoma of skin of right upper limb, including shoulder: Secondary | ICD-10-CM

## 2017-08-07 DIAGNOSIS — J069 Acute upper respiratory infection, unspecified: Secondary | ICD-10-CM | POA: Insufficient documentation

## 2017-08-07 DIAGNOSIS — M67441 Ganglion, right hand: Secondary | ICD-10-CM | POA: Insufficient documentation

## 2017-08-07 DIAGNOSIS — N63 Unspecified lump in unspecified breast: Secondary | ICD-10-CM | POA: Insufficient documentation

## 2017-08-07 DIAGNOSIS — B9789 Other viral agents as the cause of diseases classified elsewhere: Principal | ICD-10-CM

## 2017-08-07 HISTORY — DX: Squamous cell carcinoma of skin of right upper limb, including shoulder: C44.622

## 2017-08-07 NOTE — Assessment & Plan Note (Signed)
Influenza test is negative.  Lung exam is normal,  No signs of sinusitis or otitis either.  Symptoms appear to be secondary to viral infection.  Prednisone taper,  cough suppressant,  abx given to use if symptoms progress to include fevers, sinus or ear pain

## 2017-08-07 NOTE — Assessment & Plan Note (Signed)
Suspected by exam.  Reassurance given.  No intervention unless int interferes with function

## 2017-08-07 NOTE — Assessment & Plan Note (Signed)
Diagnosis presumed.  Scar healing well.

## 2017-08-07 NOTE — Assessment & Plan Note (Signed)
Diagnostic mammogram ordered of both breasts.

## 2017-08-15 ENCOUNTER — Ambulatory Visit
Admission: RE | Admit: 2017-08-15 | Discharge: 2017-08-15 | Disposition: A | Discharge status: Home / Self Care | Payer: BLUE CROSS/BLUE SHIELD | Source: Ambulatory Visit | Attending: Internal Medicine | Admitting: Internal Medicine

## 2017-08-15 DIAGNOSIS — N63 Unspecified lump in unspecified breast: Secondary | ICD-10-CM

## 2017-08-15 DIAGNOSIS — N6341 Unspecified lump in right breast, subareolar: Secondary | ICD-10-CM | POA: Insufficient documentation

## 2017-08-15 DIAGNOSIS — R928 Other abnormal and inconclusive findings on diagnostic imaging of breast: Secondary | ICD-10-CM | POA: Diagnosis not present

## 2017-08-15 DIAGNOSIS — N6489 Other specified disorders of breast: Secondary | ICD-10-CM | POA: Diagnosis not present

## 2017-08-15 DIAGNOSIS — N6310 Unspecified lump in the right breast, unspecified quadrant: Secondary | ICD-10-CM | POA: Insufficient documentation

## 2017-09-19 DIAGNOSIS — M9902 Segmental and somatic dysfunction of thoracic region: Secondary | ICD-10-CM | POA: Diagnosis not present

## 2017-09-19 DIAGNOSIS — M62830 Muscle spasm of back: Secondary | ICD-10-CM | POA: Diagnosis not present

## 2017-09-19 DIAGNOSIS — M5414 Radiculopathy, thoracic region: Secondary | ICD-10-CM | POA: Diagnosis not present

## 2017-09-19 DIAGNOSIS — M9903 Segmental and somatic dysfunction of lumbar region: Secondary | ICD-10-CM | POA: Diagnosis not present

## 2017-09-24 DIAGNOSIS — M62830 Muscle spasm of back: Secondary | ICD-10-CM | POA: Diagnosis not present

## 2017-09-24 DIAGNOSIS — M9902 Segmental and somatic dysfunction of thoracic region: Secondary | ICD-10-CM | POA: Diagnosis not present

## 2017-09-24 DIAGNOSIS — M9903 Segmental and somatic dysfunction of lumbar region: Secondary | ICD-10-CM | POA: Diagnosis not present

## 2017-09-24 DIAGNOSIS — M5414 Radiculopathy, thoracic region: Secondary | ICD-10-CM | POA: Diagnosis not present

## 2017-10-01 DIAGNOSIS — M9903 Segmental and somatic dysfunction of lumbar region: Secondary | ICD-10-CM | POA: Diagnosis not present

## 2017-10-01 DIAGNOSIS — M5414 Radiculopathy, thoracic region: Secondary | ICD-10-CM | POA: Diagnosis not present

## 2017-10-01 DIAGNOSIS — M9902 Segmental and somatic dysfunction of thoracic region: Secondary | ICD-10-CM | POA: Diagnosis not present

## 2017-10-01 DIAGNOSIS — M62830 Muscle spasm of back: Secondary | ICD-10-CM | POA: Diagnosis not present

## 2017-10-29 DIAGNOSIS — M62830 Muscle spasm of back: Secondary | ICD-10-CM | POA: Diagnosis not present

## 2017-10-29 DIAGNOSIS — M9902 Segmental and somatic dysfunction of thoracic region: Secondary | ICD-10-CM | POA: Diagnosis not present

## 2017-10-29 DIAGNOSIS — M5414 Radiculopathy, thoracic region: Secondary | ICD-10-CM | POA: Diagnosis not present

## 2017-10-29 DIAGNOSIS — M9903 Segmental and somatic dysfunction of lumbar region: Secondary | ICD-10-CM | POA: Diagnosis not present

## 2017-11-11 DIAGNOSIS — Z86018 Personal history of other benign neoplasm: Secondary | ICD-10-CM | POA: Diagnosis not present

## 2017-11-11 DIAGNOSIS — Z808 Family history of malignant neoplasm of other organs or systems: Secondary | ICD-10-CM | POA: Diagnosis not present

## 2017-11-11 DIAGNOSIS — L578 Other skin changes due to chronic exposure to nonionizing radiation: Secondary | ICD-10-CM | POA: Diagnosis not present

## 2017-11-11 DIAGNOSIS — Z85820 Personal history of malignant melanoma of skin: Secondary | ICD-10-CM | POA: Diagnosis not present

## 2017-12-03 DIAGNOSIS — M9902 Segmental and somatic dysfunction of thoracic region: Secondary | ICD-10-CM | POA: Diagnosis not present

## 2017-12-03 DIAGNOSIS — M62830 Muscle spasm of back: Secondary | ICD-10-CM | POA: Diagnosis not present

## 2017-12-03 DIAGNOSIS — M5414 Radiculopathy, thoracic region: Secondary | ICD-10-CM | POA: Diagnosis not present

## 2017-12-03 DIAGNOSIS — M9903 Segmental and somatic dysfunction of lumbar region: Secondary | ICD-10-CM | POA: Diagnosis not present

## 2017-12-31 DIAGNOSIS — M9903 Segmental and somatic dysfunction of lumbar region: Secondary | ICD-10-CM | POA: Diagnosis not present

## 2017-12-31 DIAGNOSIS — M5414 Radiculopathy, thoracic region: Secondary | ICD-10-CM | POA: Diagnosis not present

## 2017-12-31 DIAGNOSIS — M9902 Segmental and somatic dysfunction of thoracic region: Secondary | ICD-10-CM | POA: Diagnosis not present

## 2017-12-31 DIAGNOSIS — M62830 Muscle spasm of back: Secondary | ICD-10-CM | POA: Diagnosis not present

## 2018-02-03 DIAGNOSIS — M9902 Segmental and somatic dysfunction of thoracic region: Secondary | ICD-10-CM | POA: Diagnosis not present

## 2018-02-03 DIAGNOSIS — M9903 Segmental and somatic dysfunction of lumbar region: Secondary | ICD-10-CM | POA: Diagnosis not present

## 2018-02-03 DIAGNOSIS — M62830 Muscle spasm of back: Secondary | ICD-10-CM | POA: Diagnosis not present

## 2018-02-03 DIAGNOSIS — M5414 Radiculopathy, thoracic region: Secondary | ICD-10-CM | POA: Diagnosis not present

## 2018-03-03 DIAGNOSIS — M5414 Radiculopathy, thoracic region: Secondary | ICD-10-CM | POA: Diagnosis not present

## 2018-03-03 DIAGNOSIS — M62830 Muscle spasm of back: Secondary | ICD-10-CM | POA: Diagnosis not present

## 2018-03-03 DIAGNOSIS — M9903 Segmental and somatic dysfunction of lumbar region: Secondary | ICD-10-CM | POA: Diagnosis not present

## 2018-03-03 DIAGNOSIS — M9902 Segmental and somatic dysfunction of thoracic region: Secondary | ICD-10-CM | POA: Diagnosis not present

## 2018-03-31 DIAGNOSIS — M9902 Segmental and somatic dysfunction of thoracic region: Secondary | ICD-10-CM | POA: Diagnosis not present

## 2018-03-31 DIAGNOSIS — M5414 Radiculopathy, thoracic region: Secondary | ICD-10-CM | POA: Diagnosis not present

## 2018-03-31 DIAGNOSIS — M9903 Segmental and somatic dysfunction of lumbar region: Secondary | ICD-10-CM | POA: Diagnosis not present

## 2018-03-31 DIAGNOSIS — M62830 Muscle spasm of back: Secondary | ICD-10-CM | POA: Diagnosis not present

## 2018-04-28 DIAGNOSIS — M9903 Segmental and somatic dysfunction of lumbar region: Secondary | ICD-10-CM | POA: Diagnosis not present

## 2018-04-28 DIAGNOSIS — M9902 Segmental and somatic dysfunction of thoracic region: Secondary | ICD-10-CM | POA: Diagnosis not present

## 2018-04-28 DIAGNOSIS — M62830 Muscle spasm of back: Secondary | ICD-10-CM | POA: Diagnosis not present

## 2018-04-28 DIAGNOSIS — M5414 Radiculopathy, thoracic region: Secondary | ICD-10-CM | POA: Diagnosis not present

## 2018-04-30 DIAGNOSIS — B351 Tinea unguium: Secondary | ICD-10-CM | POA: Diagnosis not present

## 2018-04-30 DIAGNOSIS — D485 Neoplasm of uncertain behavior of skin: Secondary | ICD-10-CM | POA: Diagnosis not present

## 2018-04-30 DIAGNOSIS — L578 Other skin changes due to chronic exposure to nonionizing radiation: Secondary | ICD-10-CM | POA: Diagnosis not present

## 2018-04-30 DIAGNOSIS — Z1283 Encounter for screening for malignant neoplasm of skin: Secondary | ICD-10-CM | POA: Diagnosis not present

## 2018-04-30 DIAGNOSIS — Z85820 Personal history of malignant melanoma of skin: Secondary | ICD-10-CM | POA: Diagnosis not present

## 2018-05-26 DIAGNOSIS — M9902 Segmental and somatic dysfunction of thoracic region: Secondary | ICD-10-CM | POA: Diagnosis not present

## 2018-05-26 DIAGNOSIS — M62830 Muscle spasm of back: Secondary | ICD-10-CM | POA: Diagnosis not present

## 2018-05-26 DIAGNOSIS — M9903 Segmental and somatic dysfunction of lumbar region: Secondary | ICD-10-CM | POA: Diagnosis not present

## 2018-05-26 DIAGNOSIS — M5414 Radiculopathy, thoracic region: Secondary | ICD-10-CM | POA: Diagnosis not present

## 2018-06-05 DIAGNOSIS — H35371 Puckering of macula, right eye: Secondary | ICD-10-CM | POA: Diagnosis not present

## 2018-06-05 DIAGNOSIS — H43811 Vitreous degeneration, right eye: Secondary | ICD-10-CM | POA: Diagnosis not present

## 2018-06-05 DIAGNOSIS — H33011 Retinal detachment with single break, right eye: Secondary | ICD-10-CM | POA: Diagnosis not present

## 2018-06-08 DIAGNOSIS — M25531 Pain in right wrist: Secondary | ICD-10-CM | POA: Diagnosis not present

## 2018-06-30 DIAGNOSIS — M25531 Pain in right wrist: Secondary | ICD-10-CM | POA: Diagnosis not present

## 2018-06-30 DIAGNOSIS — S63501A Unspecified sprain of right wrist, initial encounter: Secondary | ICD-10-CM | POA: Diagnosis not present

## 2018-06-30 DIAGNOSIS — M25521 Pain in right elbow: Secondary | ICD-10-CM | POA: Diagnosis not present

## 2018-06-30 DIAGNOSIS — W010XXA Fall on same level from slipping, tripping and stumbling without subsequent striking against object, initial encounter: Secondary | ICD-10-CM | POA: Diagnosis not present

## 2018-07-01 DIAGNOSIS — M9902 Segmental and somatic dysfunction of thoracic region: Secondary | ICD-10-CM | POA: Diagnosis not present

## 2018-07-01 DIAGNOSIS — M9903 Segmental and somatic dysfunction of lumbar region: Secondary | ICD-10-CM | POA: Diagnosis not present

## 2018-07-01 DIAGNOSIS — M62830 Muscle spasm of back: Secondary | ICD-10-CM | POA: Diagnosis not present

## 2018-07-01 DIAGNOSIS — M5414 Radiculopathy, thoracic region: Secondary | ICD-10-CM | POA: Diagnosis not present

## 2018-07-29 DIAGNOSIS — M9902 Segmental and somatic dysfunction of thoracic region: Secondary | ICD-10-CM | POA: Diagnosis not present

## 2018-07-29 DIAGNOSIS — M9903 Segmental and somatic dysfunction of lumbar region: Secondary | ICD-10-CM | POA: Diagnosis not present

## 2018-07-29 DIAGNOSIS — M62830 Muscle spasm of back: Secondary | ICD-10-CM | POA: Diagnosis not present

## 2018-07-29 DIAGNOSIS — M5414 Radiculopathy, thoracic region: Secondary | ICD-10-CM | POA: Diagnosis not present

## 2018-09-02 DIAGNOSIS — M62830 Muscle spasm of back: Secondary | ICD-10-CM | POA: Diagnosis not present

## 2018-09-02 DIAGNOSIS — M9902 Segmental and somatic dysfunction of thoracic region: Secondary | ICD-10-CM | POA: Diagnosis not present

## 2018-09-02 DIAGNOSIS — M5414 Radiculopathy, thoracic region: Secondary | ICD-10-CM | POA: Diagnosis not present

## 2018-09-02 DIAGNOSIS — M9903 Segmental and somatic dysfunction of lumbar region: Secondary | ICD-10-CM | POA: Diagnosis not present

## 2018-09-30 DIAGNOSIS — M9902 Segmental and somatic dysfunction of thoracic region: Secondary | ICD-10-CM | POA: Diagnosis not present

## 2018-09-30 DIAGNOSIS — M5414 Radiculopathy, thoracic region: Secondary | ICD-10-CM | POA: Diagnosis not present

## 2018-09-30 DIAGNOSIS — M62830 Muscle spasm of back: Secondary | ICD-10-CM | POA: Diagnosis not present

## 2018-09-30 DIAGNOSIS — M9903 Segmental and somatic dysfunction of lumbar region: Secondary | ICD-10-CM | POA: Diagnosis not present

## 2018-10-28 DIAGNOSIS — Z85820 Personal history of malignant melanoma of skin: Secondary | ICD-10-CM | POA: Diagnosis not present

## 2018-10-28 DIAGNOSIS — L578 Other skin changes due to chronic exposure to nonionizing radiation: Secondary | ICD-10-CM | POA: Diagnosis not present

## 2018-10-28 DIAGNOSIS — Z86018 Personal history of other benign neoplasm: Secondary | ICD-10-CM | POA: Diagnosis not present

## 2018-11-04 DIAGNOSIS — M5414 Radiculopathy, thoracic region: Secondary | ICD-10-CM | POA: Diagnosis not present

## 2018-11-04 DIAGNOSIS — M9902 Segmental and somatic dysfunction of thoracic region: Secondary | ICD-10-CM | POA: Diagnosis not present

## 2018-11-04 DIAGNOSIS — M9903 Segmental and somatic dysfunction of lumbar region: Secondary | ICD-10-CM | POA: Diagnosis not present

## 2018-11-04 DIAGNOSIS — M62830 Muscle spasm of back: Secondary | ICD-10-CM | POA: Diagnosis not present

## 2018-12-02 DIAGNOSIS — M5414 Radiculopathy, thoracic region: Secondary | ICD-10-CM | POA: Diagnosis not present

## 2018-12-02 DIAGNOSIS — M9902 Segmental and somatic dysfunction of thoracic region: Secondary | ICD-10-CM | POA: Diagnosis not present

## 2018-12-02 DIAGNOSIS — M9903 Segmental and somatic dysfunction of lumbar region: Secondary | ICD-10-CM | POA: Diagnosis not present

## 2018-12-02 DIAGNOSIS — M62830 Muscle spasm of back: Secondary | ICD-10-CM | POA: Diagnosis not present

## 2018-12-30 DIAGNOSIS — M9902 Segmental and somatic dysfunction of thoracic region: Secondary | ICD-10-CM | POA: Diagnosis not present

## 2018-12-30 DIAGNOSIS — M5414 Radiculopathy, thoracic region: Secondary | ICD-10-CM | POA: Diagnosis not present

## 2018-12-30 DIAGNOSIS — M62830 Muscle spasm of back: Secondary | ICD-10-CM | POA: Diagnosis not present

## 2018-12-30 DIAGNOSIS — M9903 Segmental and somatic dysfunction of lumbar region: Secondary | ICD-10-CM | POA: Diagnosis not present

## 2019-02-03 DIAGNOSIS — M9902 Segmental and somatic dysfunction of thoracic region: Secondary | ICD-10-CM | POA: Diagnosis not present

## 2019-02-03 DIAGNOSIS — M62830 Muscle spasm of back: Secondary | ICD-10-CM | POA: Diagnosis not present

## 2019-02-03 DIAGNOSIS — M5414 Radiculopathy, thoracic region: Secondary | ICD-10-CM | POA: Diagnosis not present

## 2019-02-03 DIAGNOSIS — M9903 Segmental and somatic dysfunction of lumbar region: Secondary | ICD-10-CM | POA: Diagnosis not present

## 2019-03-03 DIAGNOSIS — M62830 Muscle spasm of back: Secondary | ICD-10-CM | POA: Diagnosis not present

## 2019-03-03 DIAGNOSIS — M5414 Radiculopathy, thoracic region: Secondary | ICD-10-CM | POA: Diagnosis not present

## 2019-03-03 DIAGNOSIS — M9902 Segmental and somatic dysfunction of thoracic region: Secondary | ICD-10-CM | POA: Diagnosis not present

## 2019-03-03 DIAGNOSIS — M9903 Segmental and somatic dysfunction of lumbar region: Secondary | ICD-10-CM | POA: Diagnosis not present

## 2019-03-11 DIAGNOSIS — H11152 Pinguecula, left eye: Secondary | ICD-10-CM | POA: Diagnosis not present

## 2019-03-11 DIAGNOSIS — Z23 Encounter for immunization: Secondary | ICD-10-CM | POA: Diagnosis not present

## 2019-03-12 DIAGNOSIS — H1132 Conjunctival hemorrhage, left eye: Secondary | ICD-10-CM | POA: Diagnosis not present

## 2019-03-31 DIAGNOSIS — M9903 Segmental and somatic dysfunction of lumbar region: Secondary | ICD-10-CM | POA: Diagnosis not present

## 2019-03-31 DIAGNOSIS — M9902 Segmental and somatic dysfunction of thoracic region: Secondary | ICD-10-CM | POA: Diagnosis not present

## 2019-03-31 DIAGNOSIS — M62830 Muscle spasm of back: Secondary | ICD-10-CM | POA: Diagnosis not present

## 2019-03-31 DIAGNOSIS — M5414 Radiculopathy, thoracic region: Secondary | ICD-10-CM | POA: Diagnosis not present

## 2019-04-28 DIAGNOSIS — M9903 Segmental and somatic dysfunction of lumbar region: Secondary | ICD-10-CM | POA: Diagnosis not present

## 2019-04-28 DIAGNOSIS — M9902 Segmental and somatic dysfunction of thoracic region: Secondary | ICD-10-CM | POA: Diagnosis not present

## 2019-04-28 DIAGNOSIS — M5414 Radiculopathy, thoracic region: Secondary | ICD-10-CM | POA: Diagnosis not present

## 2019-04-28 DIAGNOSIS — M62830 Muscle spasm of back: Secondary | ICD-10-CM | POA: Diagnosis not present

## 2019-05-26 DIAGNOSIS — M9903 Segmental and somatic dysfunction of lumbar region: Secondary | ICD-10-CM | POA: Diagnosis not present

## 2019-05-26 DIAGNOSIS — M62830 Muscle spasm of back: Secondary | ICD-10-CM | POA: Diagnosis not present

## 2019-05-26 DIAGNOSIS — M9902 Segmental and somatic dysfunction of thoracic region: Secondary | ICD-10-CM | POA: Diagnosis not present

## 2019-05-26 DIAGNOSIS — M5414 Radiculopathy, thoracic region: Secondary | ICD-10-CM | POA: Diagnosis not present

## 2019-06-01 ENCOUNTER — Other Ambulatory Visit: Payer: Self-pay

## 2019-06-01 ENCOUNTER — Encounter: Payer: Self-pay | Admitting: Internal Medicine

## 2019-06-01 ENCOUNTER — Ambulatory Visit (INDEPENDENT_AMBULATORY_CARE_PROVIDER_SITE_OTHER): Payer: BC Managed Care – PPO | Admitting: Internal Medicine

## 2019-06-01 VITALS — Ht 68.0 in | Wt 202.0 lb

## 2019-06-01 DIAGNOSIS — R5383 Other fatigue: Secondary | ICD-10-CM

## 2019-06-01 DIAGNOSIS — G8929 Other chronic pain: Secondary | ICD-10-CM | POA: Insufficient documentation

## 2019-06-01 DIAGNOSIS — N926 Irregular menstruation, unspecified: Secondary | ICD-10-CM

## 2019-06-01 DIAGNOSIS — Z0000 Encounter for general adult medical examination without abnormal findings: Secondary | ICD-10-CM

## 2019-06-01 DIAGNOSIS — M25551 Pain in right hip: Secondary | ICD-10-CM

## 2019-06-01 DIAGNOSIS — R635 Abnormal weight gain: Secondary | ICD-10-CM

## 2019-06-01 NOTE — Assessment & Plan Note (Signed)

## 2019-06-01 NOTE — Assessment & Plan Note (Signed)
Post traumatic after several falls.  Popping and pain  Plain films needed

## 2019-06-01 NOTE — Assessment & Plan Note (Signed)
She has had difficulty losing weight despite a careful diet and regular exercise.  Will rule out thyroid disease and diabetes.

## 2019-06-01 NOTE — Progress Notes (Signed)
Virtual Visit via [email or web address]  This visit type was conducted due to national recommendations for restrictions regarding the COVID-19 pandemic (e.g. social distancing).  This format is felt to be most appropriate for this patient at this time.  All issues noted in this document were discussed and addressed.  No physical exam was performed (except for noted visual exam findings with Video Visits).   I connected with@ on 06/01/19 at  2:00 PM EST by a video enabled telemedicine application and verified that I am speaking with the correct person using two identifiers. Location patient: home Location provider: work or home office Persons participating in the virtual visit: patient, provider  I discussed the limitations, risks, security and privacy concerns of performing an evaluation and management service by telephone and the availability of in person appointments. I also discussed with the patient that there may be a patient responsible charge related to this service. The patient expressed understanding and agreed to proceed.  Patient ID: Alice Spencer, female    DOB: 08-11-1965  Age: 53 y.o. MRN: [id number]  The patient is here for annual examination and management of other chronic and acute problems.  Health maintenance:  PAP smear  Normal 2015  Flu vaccine HIV screen Mammogram Feb 2019 Colonoscopy 2017    The risk factors are reflected in the social history.  The roster of all physicians providing medical care to patient - is listed in the Snapshot section of the chart.  Activities of daily living:  The patient is 100% independent in all ADLs: dressing, toileting, feeding as well as independent mobility  Home safety : The patient has smoke detectors in the home. They wear seatbelts.  There are no firearms at home. There is no violence in the home.   There is no risks for hepatitis, STDs or HIV. There is no   history of blood transfusion. They have no travel history to infectious disease endemic  areas of the world.  The patient has seen their dentist in the last six month. They have seen their eye doctor in the last year. She denies slight hearing difficulty with regard to whispered voices and some television programs.  She does not   have excessive sun exposure. Discussed the need for sun protection: hats, long sleeves and use of sunscreen if there is significant sun exposure.   Diet: the importance of a healthy diet is discussed. They do have a healthy diet.  The benefits of regular aerobic exercise were discussed. She walks 4 times per week ,  20 minutes.   Depression screen: there are no signs or vegative symptoms of depression- irritability, change in appetite, anhedonia, sadness/tearfullness.  The following portions of the patient's history were reviewed and updated as appropriate: allergies, current medications, past family history, past medical history,  past surgical history, past social history  and problem list.  Visual acuity was not assessed per patient preference since she has regular follow up with her ophthalmologist. Hearing and body mass index were assessed and reviewed.   During the course of the visit the patient was educated and counseled about appropriate screening and preventive services including : fall prevention , diabetes screening, nutrition counseling, colorectal cancer screening, and recommended immunizations.    CC: The primary encounter diagnosis was Hip pain, chronic, right. Diagnoses of Weight gain, Fatigue, unspecified type, Irregular periods, Encounter for preventive health examination, Excessive body weight gain, and Chronic hip pain, right were also pertinent to this visit.  Unable to lose weight despite regular  exercise and careful diet   Husband had COVID in late October  She was not tested   6 periods in the last 12 months   Personal goal is 165.    Low body temp,  Hair loss,  Weight gain  Dry hair .  Some change in bowels.  Had recurrent  hot flashes .  Tired black  cohosh   which has helped reduce the flushing  Some sleep latency  About 30 minutes  Averaging 8 hours .  husband reports snoring but no apneic spells.   Right hip pain/popping  Started after a fall one year ago  ,  4 times in 2 weeks , wearing [employer] clogs  has necessitated sleeping on her back   History Alice Spencer has a past medical history of Allergy, Chicken pox, and Migraines.   She has a past surgical history that includes Tonsillectomy (1973); Breast surgery (Left, 2009); Breast biopsy (Left, 2014); Breast excisional biopsy (Left, 09/28/2007); and Colonoscopy with propofol (N/A, 11/08/2015).   Her family history includes Arthritis in her father and maternal grandfather; Cancer in her maternal grandfather, maternal uncle, maternal uncle, paternal aunt, and paternal grandfather; Diabetes in her father.She reports that she has never smoked. She has never used smokeless tobacco. She reports current alcohol use of about 2.0 standard drinks of alcohol per week. She reports that she does not use drugs.  Outpatient Medications Prior to Visit  Medication Sig Dispense Refill  . guaiFENesin-codeine (CHERATUSSIN AC) 100-10 MG/5ML syrup Take 5 mLs by mouth 3 (three) times daily as needed for cough. (Patient not taking: Reported on 06/01/2019) 120 mL 0  . levofloxacin (LEVAQUIN) 500 MG tablet Take 1 tablet (500 mg total) by mouth daily. (Patient not taking: Reported on 06/01/2019) 7 tablet 0  . meloxicam (MOBIC) 15 MG tablet Take 1 tablet (15 mg total) by mouth daily. 30 tablet 3  . predniSONE (DELTASONE) 10 MG tablet 6 tablets on Day 1 , then reduce by 1 tablet daily until gone 21 tablet 0  . spironolactone (ALDACTONE) 25 MG tablet Take 1 tablet (25 mg total) by mouth daily. As needed for fluid retention 30 tablet 3   No facility-administered medications prior to visit.     Review of Systems  Patient denies headache, fevers, malaise, unintentional weight loss, skin rash, eye  pain, sinus congestion and sinus pain, sore throat, dysphagia,  hemoptysis , cough, dyspnea, wheezing, chest pain, palpitations, orthopnea, edema, abdominal pain, nausea, melena, diarrhea, constipation, flank pain, dysuria, hematuria, urinary  Frequency, nocturia, numbness, tingling, seizures,  Focal weakness, Loss of consciousness,  Tremor, insomnia, depression, anxiety, and suicidal ideation.     Objective:  Ht 5\' 8"  (1.727 m)   Wt 202 lb (91.6 kg)   BMI 30.71 kg/m   EXAM:  VITALS per patient if applicable:  GENERAL: alert, oriented, appears well and in no acute distress  HEENT: atraumatic, conjunttiva clear, no obvious abnormalities on inspection of external nose and ears  NECK: normal movements of the head and neck  LUNGS: on inspection no signs of respiratory distress, breathing rate appears normal, no obvious gross SOB, gasping or wheezing  CV: no obvious cyanosis  MS: moves all visible extremities without noticeable abnormality  PSYCH/NEURO: pleasant and cooperative, no obvious depression or anxiety, speech and thought processing grossly intact  ASSESSMENT AND PLAN:  Discussed the following assessment and plan:  Hip pain, chronic, right - Plan: DG Hip Unilat W OR W/O Pelvis 2-3 Views Right, CANCELED: DG Hip Unilat W OR  W/O Pelvis 2-3 Views Right  Weight gain - Plan: Lipid panel, TSH  Fatigue, unspecified type - Plan: Comprehensive metabolic panel, CBC with Differential/Platelet  Irregular periods - Plan: Follicle stimulating hormone, LH  Encounter for preventive health examination  Excessive body weight gain - Plan: Hemoglobin A1c  Chronic hip pain, right  Encounter for preventive health examination age appropriate education and counseling updated, referrals for preventative services and immunizations addressed, dietary and smoking counseling addressed, most recent labs reviewed.  I have personally reviewed and have noted:  1) the patient's medical and social  history 2) The pt's use of alcohol, tobacco, and illicit drugs 3) The patient's current medications and supplements 4) Functional ability including ADL's, fall risk, home safety risk, hearing and visual impairment 5) Diet and physical activities 6) Evidence for depression or mood disorder 7) The patient's height, weight, and BMI have been recorded in the chart  I have made referrals, and provided counseling and education based on review of the above  Excessive body weight gain She has had difficulty losing weight despite a careful diet and regular exercise.  Will rule out thyroid disease and diabetes.   Chronic hip pain, right Post traumatic after several falls.  Popping and pain  Plain films needed     I discussed the assessment and treatment plan with the patient. The patient was provided an opportunity to ask questions and all were answered. The patient agreed with the plan and demonstrated an understanding of the instructions.   The patient was advised to call back or seek an in-person evaluation if the symptoms worsen or if the condition fails to improve as anticipated.  Crecencio Mc, MD

## 2019-06-03 ENCOUNTER — Telehealth: Payer: Self-pay | Admitting: Internal Medicine

## 2019-06-04 ENCOUNTER — Other Ambulatory Visit (INDEPENDENT_AMBULATORY_CARE_PROVIDER_SITE_OTHER): Payer: BC Managed Care – PPO

## 2019-06-04 ENCOUNTER — Other Ambulatory Visit: Payer: Self-pay

## 2019-06-04 DIAGNOSIS — R635 Abnormal weight gain: Secondary | ICD-10-CM

## 2019-06-04 DIAGNOSIS — N926 Irregular menstruation, unspecified: Secondary | ICD-10-CM | POA: Diagnosis not present

## 2019-06-04 DIAGNOSIS — R5383 Other fatigue: Secondary | ICD-10-CM

## 2019-06-04 LAB — LIPID PANEL
Cholesterol: 198 mg/dL (ref 0–200)
HDL: 65.6 mg/dL (ref >39.00)
LDL Cholesterol: 111 mg/dL — ABNORMAL HIGH (ref 0–99)
NonHDL: 132.63
Total CHOL/HDL Ratio: 3
Triglycerides: 109 mg/dL (ref 0.0–149.0)
VLDL: 21.8 mg/dL (ref 0.0–40.0)

## 2019-06-04 LAB — COMPREHENSIVE METABOLIC PANEL
ALT: 14 U/L (ref 0–35)
AST: 15 U/L (ref 0–37)
Albumin: 4.1 g/dL (ref 3.5–5.2)
Alkaline Phosphatase: 73 U/L (ref 39–117)
BUN: 15 mg/dL (ref 6–23)
CO2: 27 mEq/L (ref 19–32)
Calcium: 8.9 mg/dL (ref 8.4–10.5)
Chloride: 106 mEq/L (ref 96–112)
Creatinine, Ser: 0.79 mg/dL (ref 0.40–1.20)
GFR: 75.94 mL/min (ref >60.00)
Glucose, Bld: 94 mg/dL (ref 70–99)
Potassium: 4.4 mEq/L (ref 3.5–5.1)
Sodium: 140 mEq/L (ref 135–145)
Total Bilirubin: 0.7 mg/dL (ref 0.2–1.2)
Total Protein: 6.5 g/dL (ref 6.0–8.3)

## 2019-06-04 LAB — CBC WITH DIFFERENTIAL/PLATELET
Basophils Absolute: 0.1 10*3/uL (ref 0.0–0.1)
Basophils Relative: 1.5 % (ref 0.0–3.0)
Eosinophils Absolute: 0.1 10*3/uL (ref 0.0–0.7)
Eosinophils Relative: 2.5 % (ref 0.0–5.0)
HCT: 37.3 % (ref 36.0–46.0)
Hemoglobin: 12.5 g/dL (ref 12.0–15.0)
Lymphocytes Relative: 23.4 % (ref 12.0–46.0)
Lymphs Abs: 1.1 10*3/uL (ref 0.7–4.0)
MCHC: 33.4 g/dL (ref 30.0–36.0)
MCV: 91.1 fl (ref 78.0–100.0)
Monocytes Absolute: 0.3 10*3/uL (ref 0.1–1.0)
Monocytes Relative: 6.5 % (ref 3.0–12.0)
Neutro Abs: 3.2 10*3/uL (ref 1.4–7.7)
Neutrophils Relative %: 66.1 % (ref 43.0–77.0)
Platelets: 238 10*3/uL (ref 150.0–400.0)
RBC: 4.1 Mil/uL (ref 3.87–5.11)
RDW: 13.1 % (ref 11.5–15.5)
WBC: 4.9 10*3/uL (ref 4.0–10.5)

## 2019-06-04 LAB — TSH: TSH: 2.61 u[IU]/mL (ref 0.35–4.50)

## 2019-06-04 LAB — HEMOGLOBIN A1C: Hgb A1c MFr Bld: 5.7 % (ref 4.6–6.5)

## 2019-06-04 LAB — FOLLICLE STIMULATING HORMONE: FSH: 16.1 m[IU]/mL

## 2019-06-04 LAB — LUTEINIZING HORMONE: LH: 9.08 m[IU]/mL

## 2019-06-07 ENCOUNTER — Ambulatory Visit
Admission: RE | Admit: 2019-06-07 | Discharge: 2019-06-07 | Disposition: A | Discharge status: Home / Self Care | Payer: BC Managed Care – PPO | Source: Ambulatory Visit | Attending: Internal Medicine | Admitting: Internal Medicine

## 2019-06-07 ENCOUNTER — Other Ambulatory Visit: Payer: Self-pay | Admitting: Internal Medicine

## 2019-06-07 DIAGNOSIS — Z1231 Encounter for screening mammogram for malignant neoplasm of breast: Secondary | ICD-10-CM

## 2019-06-28 ENCOUNTER — Ambulatory Visit
Admission: RE | Admit: 2019-06-28 | Discharge: 2019-06-28 | Disposition: A | Discharge status: Home / Self Care | Payer: BC Managed Care – PPO | Attending: Internal Medicine | Admitting: Internal Medicine

## 2019-06-28 ENCOUNTER — Ambulatory Visit
Admission: RE | Admit: 2019-06-28 | Discharge: 2019-06-28 | Disposition: A | Discharge status: Home / Self Care | Payer: BC Managed Care – PPO | Source: Ambulatory Visit | Attending: Internal Medicine | Admitting: Internal Medicine

## 2019-06-28 ENCOUNTER — Other Ambulatory Visit: Payer: Self-pay

## 2019-06-28 DIAGNOSIS — R296 Repeated falls: Secondary | ICD-10-CM | POA: Diagnosis not present

## 2019-06-28 DIAGNOSIS — G8929 Other chronic pain: Secondary | ICD-10-CM | POA: Diagnosis not present

## 2019-06-28 DIAGNOSIS — S79911A Unspecified injury of right hip, initial encounter: Secondary | ICD-10-CM | POA: Diagnosis not present

## 2019-06-28 DIAGNOSIS — M25551 Pain in right hip: Secondary | ICD-10-CM | POA: Insufficient documentation

## 2019-07-07 ENCOUNTER — Other Ambulatory Visit: Payer: Self-pay | Admitting: Internal Medicine

## 2019-07-07 DIAGNOSIS — G8929 Other chronic pain: Secondary | ICD-10-CM

## 2019-07-07 MED ORDER — MELOXICAM 15 MG PO TABS: 15.0000 mg | ORAL_TABLET | Freq: Every day | ORAL | 0 refills | Status: DC

## 2019-07-13 DIAGNOSIS — S76011A Strain of muscle, fascia and tendon of right hip, initial encounter: Secondary | ICD-10-CM | POA: Diagnosis not present

## 2019-07-13 DIAGNOSIS — M169 Osteoarthritis of hip, unspecified: Secondary | ICD-10-CM | POA: Insufficient documentation

## 2019-07-13 DIAGNOSIS — M1611 Unilateral primary osteoarthritis, right hip: Secondary | ICD-10-CM | POA: Diagnosis not present

## 2019-07-15 DIAGNOSIS — M25651 Stiffness of right hip, not elsewhere classified: Secondary | ICD-10-CM | POA: Diagnosis not present

## 2019-07-15 DIAGNOSIS — M25551 Pain in right hip: Secondary | ICD-10-CM | POA: Diagnosis not present

## 2019-07-19 DIAGNOSIS — M25651 Stiffness of right hip, not elsewhere classified: Secondary | ICD-10-CM | POA: Diagnosis not present

## 2019-07-19 DIAGNOSIS — M25551 Pain in right hip: Secondary | ICD-10-CM | POA: Diagnosis not present

## 2019-07-22 DIAGNOSIS — M25551 Pain in right hip: Secondary | ICD-10-CM | POA: Diagnosis not present

## 2019-07-22 DIAGNOSIS — M25651 Stiffness of right hip, not elsewhere classified: Secondary | ICD-10-CM | POA: Diagnosis not present

## 2019-07-28 DIAGNOSIS — M25551 Pain in right hip: Secondary | ICD-10-CM | POA: Diagnosis not present

## 2019-07-28 DIAGNOSIS — M25651 Stiffness of right hip, not elsewhere classified: Secondary | ICD-10-CM | POA: Diagnosis not present

## 2019-07-30 DIAGNOSIS — M25651 Stiffness of right hip, not elsewhere classified: Secondary | ICD-10-CM | POA: Diagnosis not present

## 2019-07-30 DIAGNOSIS — M25551 Pain in right hip: Secondary | ICD-10-CM | POA: Diagnosis not present

## 2019-08-06 DIAGNOSIS — H33011 Retinal detachment with single break, right eye: Secondary | ICD-10-CM | POA: Diagnosis not present

## 2019-08-06 DIAGNOSIS — H35371 Puckering of macula, right eye: Secondary | ICD-10-CM | POA: Diagnosis not present

## 2019-08-06 DIAGNOSIS — H43811 Vitreous degeneration, right eye: Secondary | ICD-10-CM | POA: Diagnosis not present

## 2019-08-06 DIAGNOSIS — H5213 Myopia, bilateral: Secondary | ICD-10-CM | POA: Diagnosis not present

## 2019-10-26 LAB — LIPID PANEL: Cholesterol: 180 (ref 0–200)

## 2019-10-26 LAB — NOVEL CORONAVIRUS, NAA: SARS-CoV-2, NAA: POSITIVE

## 2019-12-02 ENCOUNTER — Other Ambulatory Visit (HOSPITAL_COMMUNITY)
Admission: RE | Admit: 2019-12-02 | Discharge: 2019-12-02 | Disposition: A | Discharge status: Home / Self Care | Payer: BC Managed Care – PPO | Source: Ambulatory Visit | Attending: Internal Medicine | Admitting: Internal Medicine

## 2019-12-02 ENCOUNTER — Other Ambulatory Visit: Payer: Self-pay

## 2019-12-02 ENCOUNTER — Ambulatory Visit: Payer: BC Managed Care – PPO | Admitting: Internal Medicine

## 2019-12-02 ENCOUNTER — Encounter: Payer: Self-pay | Admitting: Internal Medicine

## 2019-12-02 VITALS — BP 126/74 | HR 76 | Temp 97.1°F | Resp 14 | Ht 68.0 in | Wt 172.8 lb

## 2019-12-02 DIAGNOSIS — E663 Overweight: Secondary | ICD-10-CM

## 2019-12-02 DIAGNOSIS — Z124 Encounter for screening for malignant neoplasm of cervix: Secondary | ICD-10-CM | POA: Insufficient documentation

## 2019-12-02 DIAGNOSIS — Z8616 Personal history of COVID-19: Secondary | ICD-10-CM | POA: Diagnosis not present

## 2019-12-02 MED ORDER — MELOXICAM 15 MG PO TABS: 15.0000 mg | ORAL_TABLET | Freq: Every day | ORAL | 0 refills | Status: DC

## 2019-12-02 NOTE — Patient Instructions (Signed)
Congratulations on the weight loss!  You look fantastic!  I agree that 160 lbs is a great goal   Health Maintenance, Female Adopting a healthy lifestyle and getting preventive care are important in promoting health and wellness. Ask your health care provider about:  The right schedule for you to have regular tests and exams.  Things you can do on your own to prevent diseases and keep yourself healthy. What should I know about diet, weight, and exercise? Eat a healthy diet   Eat a diet that includes plenty of vegetables, fruits, low-fat dairy products, and lean protein.  Do not eat a lot of foods that are high in solid fats, added sugars, or sodium. Maintain a healthy weight Body mass index (BMI) is used to identify weight problems. It estimates body fat based on height and weight. Your health care provider can help determine your BMI and help you achieve or maintain a healthy weight. Get regular exercise Get regular exercise. This is one of the most important things you can do for your health. Most adults should:  Exercise for at least 150 minutes each week. The exercise should increase your heart rate and make you sweat (moderate-intensity exercise).  Do strengthening exercises at least twice a week. This is in addition to the moderate-intensity exercise.  Spend less time sitting. Even light physical activity can be beneficial. Watch cholesterol and blood lipids Have your blood tested for lipids and cholesterol at 54 years of age, then have this test every 5 years. Have your cholesterol levels checked more often if:  Your lipid or cholesterol levels are high.  You are older than 54 years of age.  You are at high risk for heart disease. What should I know about cancer screening? Depending on your health history and family history, you may need to have cancer screening at various ages. This may include screening for:  Breast cancer.  Cervical cancer.  Colorectal  cancer.  Skin cancer.  Lung cancer. What should I know about heart disease, diabetes, and high blood pressure? Blood pressure and heart disease  High blood pressure causes heart disease and increases the risk of stroke. This is more likely to develop in people who have high blood pressure readings, are of African descent, or are overweight.  Have your blood pressure checked: ? Every 3-5 years if you are 74-3 years of age. ? Every year if you are 65 years old or older. Diabetes Have regular diabetes screenings. This checks your fasting blood sugar level. Have the screening done:  Once every three years after age 81 if you are at a normal weight and have a low risk for diabetes.  More often and at a younger age if you are overweight or have a high risk for diabetes. What should I know about preventing infection? Hepatitis B If you have a higher risk for hepatitis B, you should be screened for this virus. Talk with your health care provider to find out if you are at risk for hepatitis B infection. Hepatitis C Testing is recommended for:  Everyone born from [birth year] through 1965.  Anyone with known risk factors for hepatitis C. Sexually transmitted infections (STIs)  Get screened for STIs, including gonorrhea and chlamydia, if: ? You are sexually active and are younger than 54 years of age. ? You are older than 54 years of age and your health care provider tells you that you are at risk for this type of infection. ? Your sexual activity has changed  since you were last screened, and you are at increased risk for chlamydia or gonorrhea. Ask your health care provider if you are at risk.  Ask your health care provider about whether you are at high risk for HIV. Your health care provider may recommend a prescription medicine to help prevent HIV infection. If you choose to take medicine to prevent HIV, you should first get tested for HIV. You should then be tested every 3 months for as long as  you are taking the medicine. Pregnancy  If you are about to stop having your period (premenopausal) and you may become pregnant, seek counseling before you get pregnant.  Take 400 to 800 micrograms (mcg) of folic acid every day if you become pregnant.  Ask for birth control (contraception) if you want to prevent pregnancy. Osteoporosis and menopause Osteoporosis is a disease in which the bones lose minerals and strength with aging. This can result in bone fractures. If you are 82 years old or older, or if you are at risk for osteoporosis and fractures, ask your health care provider if you should:  Be screened for bone loss.  Take a calcium or vitamin D supplement to lower your risk of fractures.  Be given hormone replacement therapy (HRT) to treat symptoms of menopause. Follow these instructions at home: Lifestyle  Do not use any products that contain nicotine or tobacco, such as cigarettes, e-cigarettes, and chewing tobacco. If you need help quitting, ask your health care provider.  Do not use street drugs.  Do not share needles.  Ask your health care provider for help if you need support or information about quitting drugs. Alcohol use  Do not drink alcohol if: ? Your health care provider tells you not to drink. ? You are pregnant, may be pregnant, or are planning to become pregnant.  If you drink alcohol: ? Limit how much you use to 0-1 drink a day. ? Limit intake if you are breastfeeding.  Be aware of how much alcohol is in your drink. In the [place] one drink equals one 12 oz bottle of beer (355 mL), one 5 oz glass of wine (148 mL), or one 1 oz glass of hard liquor (44 mL). General instructions  Schedule regular health, dental, and eye exams.  Stay current with your vaccines.  Tell your health care provider if: ? You often feel depressed. ? You have ever been abused or do not feel safe at home. Summary  Adopting a healthy lifestyle and getting preventive care are  important in promoting health and wellness.  Follow your health care provider's instructions about healthy diet, exercising, and getting tested or screened for diseases.  Follow your health care provider's instructions on monitoring your cholesterol and blood pressure. This information is not intended to replace advice given to you by your health care provider. Make sure you discuss any questions you have with your health care provider. Document Revised: 06/03/2018 Document Reviewed: 06/03/2018 Elsevier Patient Education  2020 [employer].

## 2019-12-02 NOTE — Progress Notes (Signed)
Subjective:  Patient ID: Alice Spencer, female    DOB: April 01, 1966  Age: 54 y.o. MRN: [id number]  CC: The primary encounter diagnosis was Cervical cancer screening. Diagnoses of Encounter for Papanicolaou smear for cervical cancer screening, Overweight (BMI 25.0-29.9), and History of 2019 novel coronavirus disease (COVID-19) were also pertinent to this visit.  HPI Alice Spencer presents for PAP smear and 6 month follow up  This visit occurred during the SARS-CoV-2 public health emergency.  Safety protocols were in place, including screening questions prior to the visit, additional usage of staff PPE, and extensive cleaning of exam room while observing appropriate contact time as indicated for disinfecting solutions.    Patient has received NO  doses of the available COVID 19 vaccines because she continues to demonstrate natural immunity from her infection in November.    Patient continues to mask when outside of the home except when walking in yard or at safe distances from others .  Patient denies any change in mood or development of unhealthy behaviors resuting from the pandemic's restriction of activities and socialization.    She has achieved 32 lb weight loss on the commercially lauded [place].  Still losing weight. Has set a goal of 160 lbs.   .   Outpatient Medications Prior to Visit  Medication Sig Dispense Refill  . meloxicam (MOBIC) 15 MG tablet Take 1 tablet (15 mg total) by mouth daily. 30 tablet 0   No facility-administered medications prior to visit.    Review of Systems;  Patient denies headache, fevers, malaise, unintentional weight loss, skin rash, eye pain, sinus congestion and sinus pain, sore throat, dysphagia,  hemoptysis , cough, dyspnea, wheezing, chest pain, palpitations, orthopnea, edema, abdominal pain, nausea, melena, diarrhea, constipation, flank pain, dysuria, hematuria, urinary  Frequency, nocturia, numbness, tingling, seizures,  Focal weakness, Loss of  consciousness,  Tremor, insomnia, depression, anxiety, and suicidal ideation.      Objective:  BP 126/74 (BP Location: Left Arm, Patient Position: Sitting, Cuff Size: Normal)   Pulse 76   Temp (!) 97.1 F (36.2 C) (Temporal)   Resp 14   Ht 5\' 8"  (1.727 m)   Wt 172 lb 12.8 oz (78.4 kg)   SpO2 98%   BMI 26.27 kg/m   BP Readings from Last 3 Encounters:  12/02/19 126/74  08/06/17 134/72  07/01/16 118/73    Wt Readings from Last 3 Encounters:  12/02/19 172 lb 12.8 oz (78.4 kg)  06/01/19 202 lb (91.6 kg)  08/06/17 193 lb 6.4 oz (87.7 kg)    General appearance: alert, cooperative and appears stated age Ears: normal TM's and external ear canals both ears Throat: lips, mucosa, and tongue normal; teeth and gums normal Neck: no adenopathy, no carotid bruit, supple, symmetrical, trachea midline and thyroid not enlarged, symmetric, no tenderness/mass/nodules Back: symmetric, no curvature. ROM normal. No CVA tenderness. Lungs: clear to auscultation bilaterally Heart: regular rate and rhythm, S1, S2 normal, no murmur, click, rub or gallop Abdomen: soft, non-tender; bowel sounds normal; no masses,  no organomegaly Pulses: 2+ and symmetric Skin: Skin color, texture, turgor normal. No rashes or lesions Lymph nodes: Cervical, supraclavicular, and axillary nodes normal.  Lab Results  Component Value Date   HGBA1C 5.7 06/04/2019    Lab Results  Component Value Date   CREATININE 0.79 06/04/2019   CREATININE 1.2 04/26/2014   CREATININE 0.8 09/04/2012    Lab Results  Component Value Date   WBC 4.9 06/04/2019   HGB 12.5 06/04/2019  HCT 37.3 06/04/2019   PLT 238.0 06/04/2019   GLUCOSE 94 06/04/2019   CHOL 198 06/04/2019   TRIG 109.0 06/04/2019   HDL 65.60 06/04/2019   LDLCALC 111 (H) 06/04/2019   ALT 14 06/04/2019   AST 15 06/04/2019   NA 140 06/04/2019   K 4.4 06/04/2019   CL 106 06/04/2019   CREATININE 0.79 06/04/2019   BUN 15 06/04/2019   CO2 27 06/04/2019   TSH 2.61  06/04/2019   HGBA1C 5.7 06/04/2019    DG Hip Unilat W OR W/O Pelvis 2-3 Views Right  Result Date: 06/28/2019 CLINICAL DATA:  Several falls, right hip pain EXAM: DG HIP (WITH OR WITHOUT PELVIS) 2-3V RIGHT COMPARISON:  None. FINDINGS: Alignment is anatomic. There is mild joint space narrowing. No evidence of fracture. Probable small bone island of the femoral head. IMPRESSION: Mild right hip osteoarthritis. Electronically Signed   By: Macy Mis M.D.   On: 06/28/2019 08:57    Assessment & Plan:   Problem List Items Addressed This Visit      Unprioritized   Encounter for Papanicolaou smear for cervical cancer screening    Pelvic exam and PAP smear done today. PAP smear was normal       History of 2019 novel coronavirus disease (COVID-19)    She has deferred vaccination since she continues to have natural immunity to the virus from personal history of infection.       Overweight (BMI 25.0-29.9)    I have congratulated her in reduction of   BMI and encouraged  Continued  Efforts to reach goal of 160 lbs using a low glycemic index diet and regular exercise a minimum of 5 days per week.         Other Visit Diagnoses    Cervical cancer screening    -  Primary   Relevant Orders   Cytology - PAP( [place]) (Completed)      I am having Cherolyn C. Bencomo maintain her meloxicam.  Meds ordered this encounter  Medications  . meloxicam (MOBIC) 15 MG tablet    Sig: Take 1 tablet (15 mg total) by mouth daily.    Dispense:  30 tablet    Refill:  0    Medications Discontinued During This Encounter  Medication Reason  . meloxicam (MOBIC) 15 MG tablet Reorder    Follow-up: Return in about 1 year (around 12/01/2020).   Crecencio Mc, MD

## 2019-12-03 LAB — CYTOLOGY - PAP
Comment: NEGATIVE
Diagnosis: NEGATIVE
High risk HPV: NEGATIVE

## 2019-12-05 DIAGNOSIS — Z124 Encounter for screening for malignant neoplasm of cervix: Secondary | ICD-10-CM | POA: Insufficient documentation

## 2019-12-05 DIAGNOSIS — E663 Overweight: Secondary | ICD-10-CM | POA: Insufficient documentation

## 2019-12-05 DIAGNOSIS — Z8616 Personal history of COVID-19: Secondary | ICD-10-CM | POA: Insufficient documentation

## 2019-12-05 NOTE — Assessment & Plan Note (Signed)
I have congratulated her in reduction of   BMI and encouraged  Continued  Efforts to reach goal of 160 lbs using a low glycemic index diet and regular exercise a minimum of 5 days per week.   

## 2019-12-05 NOTE — Assessment & Plan Note (Signed)
She has deferred vaccination since she continues to have natural immunity to the virus from personal history of infection.

## 2019-12-05 NOTE — Assessment & Plan Note (Addendum)
Pelvic exam and PAP smear done today. PAP smear was normal

## 2020-01-05 ENCOUNTER — Other Ambulatory Visit: Payer: Self-pay

## 2020-01-05 MED ORDER — MELOXICAM 15 MG PO TABS: 15.0000 mg | ORAL_TABLET | Freq: Every day | ORAL | 0 refills | Status: DC

## 2020-02-03 ENCOUNTER — Other Ambulatory Visit: Payer: Self-pay | Admitting: Internal Medicine

## 2020-03-03 ENCOUNTER — Other Ambulatory Visit: Payer: Self-pay | Admitting: Internal Medicine

## 2020-04-01 ENCOUNTER — Other Ambulatory Visit: Payer: Self-pay | Admitting: Internal Medicine

## 2020-04-03 ENCOUNTER — Ambulatory Visit: Payer: BC Managed Care – PPO | Admitting: Podiatry

## 2020-04-03 ENCOUNTER — Encounter: Payer: Self-pay | Admitting: Podiatry

## 2020-04-03 ENCOUNTER — Ambulatory Visit: Payer: BC Managed Care – PPO

## 2020-04-03 DIAGNOSIS — L608 Other nail disorders: Secondary | ICD-10-CM | POA: Diagnosis not present

## 2020-04-03 DIAGNOSIS — M778 Other enthesopathies, not elsewhere classified: Secondary | ICD-10-CM

## 2020-04-03 DIAGNOSIS — L603 Nail dystrophy: Secondary | ICD-10-CM | POA: Diagnosis not present

## 2020-04-03 NOTE — Addendum Note (Signed)
Addended by: Rip Harbour on: 04/03/2020 05:47 PM   Modules accepted: Orders

## 2020-04-03 NOTE — Progress Notes (Signed)
  Subjective:  Patient ID: Alice Spencer, female    DOB: 01/28/1966,  MRN: [id number] HPI Chief Complaint  Patient presents with  . Nail Problem    3rd toenail right - thick, dark nail x few years, dermatologist Rx'd Keryadin x 1 year-no help  . New Patient (Initial Visit)    Est pt 2018    54 y.o. female presents with the above complaint.   ROS: Denies fever chills nausea vomiting muscle aches pains calf pain back pain chest pain shortness of breath.  Past Medical History:  Diagnosis Date  . Allergy   . Chicken pox   . Migraines    Past Surgical History:  Procedure Laterality Date  . BREAST BIOPSY Left 2014   stereo for calcification, Fibroadenoma  . BREAST EXCISIONAL BIOPSY Left 09/28/2007   neg  . 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 COLONOSCOPY WITH PROPOFOL N/A 11/08/2015   Procedure: COLONOSCOPY WITH PROPOFOL;  Surgeon: Robert Bellow, MD;  Location: [facility] ENDOSCOPY;  Service: Endoscopy;  Laterality: N/A;  . TONSILLECTOMY  1973    Current Outpatient Medications:  .  meloxicam (MOBIC) 15 MG tablet, TAKE 1 TABLET(15 MG) BY MOUTH DAILY, Disp: 30 tablet, Rfl: 0  Allergies  Allergen Reactions  . Penicillins Rash   Review of Systems Objective:  There were no vitals filed for this visit.  General: Well developed, nourished, in no acute distress, alert and oriented x3   Dermatological: Skin is warm, dry and supple bilateral. Nails x 10 are well maintained; remaining integument appears unremarkable at this time. There are no open sores, no preulcerative lesions, no rash or signs of infection present.  Thick dystrophic possibly mycotic nail third digit right foot.  Thickening distally of the second toe nail plate.   Vascular: Dorsalis Pedis artery and Posterior Tibial artery pedal pulses are 2/4 bilateral with immedate capillary fill time. Pedal hair growth present. No varicosities and no lower extremity edema present bilateral.   Neruologic: Grossly intact  via light touch bilateral. Vibratory intact via tuning fork bilateral. Protective threshold with Semmes Wienstein monofilament intact to all pedal sites bilateral. Patellar and Achilles deep tendon reflexes 2+ bilateral. No Babinski or clonus noted bilateral.   Musculoskeletal: No gross boney pedal deformities bilateral. No pain, crepitus, or limitation noted with foot and ankle range of motion bilateral. Muscular strength 5/5 in all groups tested bilateral.  Gait: Unassisted, Nonantalgic.    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 [place], [place]

## 2020-04-13 ENCOUNTER — Encounter: Payer: Self-pay | Admitting: *Deleted

## 2020-04-24 DIAGNOSIS — Z20822 Contact with and (suspected) exposure to covid-19: Secondary | ICD-10-CM | POA: Diagnosis not present

## 2020-04-25 ENCOUNTER — Telehealth: Payer: Self-pay

## 2020-04-25 NOTE — Telephone Encounter (Signed)
Patient notified via voice mail that nail results were negative and its due to trauma.  Informed her via voice mail that she can call and cancel upcoming appt with Dr. Milinda Pointer if not having any issues.

## 2020-04-25 NOTE — Telephone Encounter (Signed)
-----   Message from Garrel Ridgel, [place] sent at 04/17/2020  6:55 AM EDT ----- Negative for fungus.

## 2020-05-02 ENCOUNTER — Other Ambulatory Visit: Payer: Self-pay | Admitting: Internal Medicine

## 2020-05-08 ENCOUNTER — Other Ambulatory Visit: Payer: Self-pay

## 2020-05-08 ENCOUNTER — Encounter: Payer: Self-pay | Admitting: Nurse Practitioner

## 2020-05-08 ENCOUNTER — Telehealth (INDEPENDENT_AMBULATORY_CARE_PROVIDER_SITE_OTHER): Payer: BC Managed Care – PPO | Admitting: Nurse Practitioner

## 2020-05-08 VITALS — Temp 97.8°F | Ht 68.0 in | Wt 165.0 lb

## 2020-05-08 DIAGNOSIS — J069 Acute upper respiratory infection, unspecified: Secondary | ICD-10-CM

## 2020-05-08 MED ORDER — PREDNISONE 10 MG PO TABS: ORAL_TABLET | ORAL | 0 refills | Status: DC

## 2020-05-08 MED ORDER — CHERATUSSIN AC 100-10 MG/5ML PO SYRP
5.0000 mL | ORAL_SOLUTION | Freq: Three times a day (TID) | ORAL | 0 refills | Status: DC | PRN
Start: 2020-05-08 — End: 2020-05-09

## 2020-05-08 MED ORDER — AZITHROMYCIN 250 MG PO TABS: ORAL_TABLET | ORAL | 0 refills | Status: DC

## 2020-05-08 NOTE — Progress Notes (Signed)
Virtual Visit via Video Note  This visit type was conducted due to national recommendations for restrictions regarding the COVID-19 pandemic (e.g. social distancing).  This format is felt to be most appropriate for this patient at this time.  All issues noted in this document were discussed and addressed.  No physical exam was performed (except for noted visual exam findings with Video Visits).   I connected with@ on 05/08/20 at  4:00 PM EST by a video enabled telemedicine application or telephone and verified that I am speaking with the correct person using two identifiers. Location patient: home Location provider: work or home office Persons participating in the virtual visit: patient, provider  I discussed the limitations, risks, security and privacy concerns of performing an evaluation and management service by telephone and the availability of in person appointments. I also discussed with the patient that there may be a patient responsible charge related to this service. The patient expressed understanding and agreed to proceed.  Reason for visit: Cough congestion since last Monday worse at night.  Covid test Tuesday PCR was negative.  HPI: This 54 year old patient reports onset last Monday evening of a tickle in her throat and Tuesday morning she had congestion with large amount of nasal discharge.  She has been coughing, much worse in early am and at night .  It is keeping her up.  She did have some greenish sputum production initially and now is more cream colored. She has been taking Mucinex DM, and her symptoms seem to be a little better.  She denies wheezing, chest tightness shortness of breath, dizziness or lightheadedness.  She has a pulse oximeter at home and the readings have been good in the 96% or greater percent range.  She has no body aches or fatigue.  As she is not sleeping very well though secondary to cough.  She has had no ill contacts.  Patient did not have the Covid vaccine.   She did have the Covid infection last year and is planning on getting the vaccine in the next few months.   ROS: See pertinent positives and negatives per HPI.  Past Medical History:  Diagnosis Date  . Allergy   . Chicken pox   . Migraines     Past Surgical History:  Procedure Laterality Date  . BREAST BIOPSY Left 2014   stereo for calcification, Fibroadenoma  . BREAST EXCISIONAL BIOPSY Left 09/28/2007   neg  . 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 COLONOSCOPY WITH PROPOFOL N/A 11/08/2015   Procedure: COLONOSCOPY WITH PROPOFOL;  Surgeon: Robert Bellow, MD;  Location: [facility] ENDOSCOPY;  Service: Endoscopy;  Laterality: N/A;  . TONSILLECTOMY  1973    Family History  Problem Relation Age of Onset  . Arthritis Father   . Diabetes Father   . Cancer Maternal Uncle        colon  . Cancer Paternal Aunt        lung  . Arthritis Maternal Grandfather   . Cancer Maternal Grandfather        colon  . Cancer Paternal Grandfather        carcinoma of the skin  . Cancer Maternal Uncle        colon  . Breast cancer Neg Hx     SOCIAL HX: non smoking   Current Outpatient Medications:  .  Black Cohosh 540 MG CAPS, Take by mouth., Disp: , Rfl:  .  Cholecalciferol (VITAMIN D3) 50 MCG (2000 UT) CAPS,  Take by mouth., Disp: , Rfl:  .  meloxicam (MOBIC) 15 MG tablet, TAKE 1 TABLET(15 MG) BY MOUTH DAILY, Disp: 30 tablet, Rfl: 0 .  azithromycin (ZITHROMAX) 250 MG tablet, Take 2 tablets ( total 500 mg) PO on day 1, then take 1 tablet ( total 250 mg) by mouth q24h x 4 days., Disp: 6 tablet, Rfl: 0 .  guaiFENesin-codeine (CHERATUSSIN AC) 100-10 MG/5ML syrup, Take 5 mLs by mouth 3 (three) times daily as needed for up to 7 days for cough or congestion., Disp: 105 mL, Rfl: 0 .  predniSONE (DELTASONE) 10 MG tablet, Take 6 tablets (60 mg total) on day 1; Take 5 tablets (50 mg total) on day 2; Take 4 tablets (40 mg total) on day 3 and continue to lower by 1 tablet daily until off., Disp: 21  tablet, Rfl: 0  EXAM:  VITALS per patient if applicable: 45.6-Y5 sat 96% weight 165  GENERAL: alert, oriented, appears well and in no acute distress but is coughing frequently throughout the interview.   HEENT: atraumatic, conjunctiva clear, no obvious abnormalities on inspection of external nose and ears  NECK: normal movements of the head and neck  LUNGS: on inspection no signs of respiratory distress, breathing rate appears normal, no obvious gross SOB, gasping. Coughing- no wheezing.   CV: no obvious cyanosis  MS: moves all visible extremities without noticeable abnormality  PSYCH/NEURO: pleasant and cooperative, no obvious depression or anxiety, speech and thought processing grossly intact  ASSESSMENT AND PLAN:  Discussed the following assessment and plan:  URI with cough and congestion - Plan: Respiratory virus panel, POCT Influenza A/B  PDMP checked and no activity reported to  review.    I discussed the assessment and treatment plan with the patient. The patient was provided an opportunity to ask questions and all were answered. The patient agreed with the plan and demonstrated an understanding of the instructions.   The patient was advised to call back or seek an in-person evaluation if the symptoms worsen or if the condition fails to improve as anticipated.  Denice Paradise, NP Adult Nurse Practitioner [place] [phone]

## 2020-05-08 NOTE — Patient Instructions (Addendum)
You have taken a negative Covid PCR test on Tuesday. You had Covid last year. When you are well again, I would recommend getting the Covid vaccine.   Please come to our drive-through clinic tomorrow for RSV and influenza throat swab.  Advised patient on supportive measures:  Get rest, drink plenty of fluids, and use tylenol or ibuprofen as needed for pain. Follow up if fever >101, if symptoms worsen or if symptoms are not improved in 3 days. Patient verbalizes understanding.   I have ordered prednisone 10 mg to take 6 pills in 1 day, decrease by 1 pill every day until off.  This is a 6-5-4-3-2-1-off taper.  This is to help decrease swelling inflammation in the lungs and sinuses and to help decrease the cough.  I have ordered Cheratussin codeine cough syrup small amount up to 3 times a day.  This is a medication that has codeine and it may make you tired.  Careful with driving and do not operate any machinery or drive and you know how you are going to respond to this medicine.  Continue taking your Mucinex DM during the day.  I have ordered a Z-Pak if symptoms do not improve.  This is an antibiotic.  If you start this, be sure to eat yogurt, or take an over-the-counter probiotic such as Align or Florastor to help prevent diarrhea.  If no improvement in 3 days, please seek care at an in person evaluation for a physical exam.  You can do this at [facility] clinic walk-in clinic or [facility] next to the [place].  Contact a health care provider if:  You are getting worse instead of better.  You have a fever or chills.  Your mucus is brown or red.  You have yellow or brown discharge coming from your nose.  You have pain in your face, especially when you bend forward.  You have swollen neck glands.  You have pain while swallowing.  You have white areas in the back of your throat. Get help right away if:  You have shortness of breath that gets worse.  You have severe or  persistent: ? Headache. ? Ear pain. ? Sinus pain. ? Chest pain.  You have chronic lung disease along with any of the following: ? Wheezing. ? Prolonged cough. ? Coughing up blood. ? A change in your usual mucus.  You have a stiff neck.  You have changes in your: ? Vision. ? Hearing. ? Thinking. ? Mood.  Upper Respiratory Infection, Adult An upper respiratory infection (URI) is a common viral infection of the nose, throat, and upper air passages that lead to the lungs. The most common type of URI is the common cold. URIs usually get better on their own, without medical treatment. What are the causes? A URI is caused by a virus. You may catch a virus by:  Breathing in droplets from an infected person's cough or sneeze.  Touching something that has been exposed to the virus (contaminated) and then touching your mouth, nose, or eyes. What increases the risk? You are more likely to get a URI if:  You are very young or very old.  It is autumn or winter.  You have close contact with others, such as at a daycare, school, or health care facility.  You smoke.  You have long-term (chronic) heart or lung disease.  You have a weakened disease-fighting (immune) system.  You have nasal allergies or asthma.  You are experiencing a lot  of stress.  You work in an area that has poor air circulation.  You have poor nutrition. What are the signs or symptoms? A URI usually involves some of the following symptoms:  Runny or stuffy (congested) nose.  Sneezing.  Cough.  Sore throat.  Headache.  Fatigue.  Fever.  Loss of appetite.  Pain in your forehead, behind your eyes, and over your cheekbones (sinus pain).  Muscle aches.  Redness or irritation of the eyes.  Pressure in the ears or face. How is this diagnosed? This condition may be diagnosed based on your medical history and symptoms, and a physical exam. Your health care provider may use a cotton swab to take a  mucus sample from your nose (nasal swab). This sample can be tested to determine what virus is causing the illness. How is this treated? URIs usually get better on their own within 7-10 days. You can take steps at home to relieve your symptoms. Medicines cannot cure URIs, but your health care provider may recommend certain medicines to help relieve symptoms, such as:  Over-the-counter cold medicines.  Cough suppressants. Coughing is a type of defense against infection that helps to clear the respiratory system, so take these medicines only as recommended by your health care provider.  Fever-reducing medicines. Follow these instructions at home: Activity  Rest as needed.  If you have a fever, stay home from work or school until your fever is gone or until your health care provider says you are no longer contagious. Your health care provider may have you wear a face mask to prevent your infection from spreading. Relieving symptoms  Gargle with a salt-water mixture 3-4 times a day or as needed. To make a salt-water mixture, completely dissolve -1 tsp of salt in 1 cup of warm water.  Use a cool-mist humidifier to add moisture to the air. This can help you breathe more easily. Eating and drinking   Drink enough fluid to keep your urine pale yellow.  Eat soups and other clear broths. General instructions   Take over-the-counter and prescription medicines only as told by your health care provider. These include cold medicines, fever reducers, and cough suppressants.  Do not use any products that contain nicotine or tobacco, such as cigarettes and e-cigarettes. If you need help quitting, ask your health care provider.  Stay away from secondhand smoke.  Stay up to date on all immunizations, including the yearly (annual) flu vaccine.  Keep all follow-up visits as told by your health care provider. This is important. How to prevent the spread of infection to others   URIs can be passed  from person to person (are contagious). To prevent the infection from spreading: ? Wash your hands often with soap and water. If soap and water are not available, use hand sanitizer. ? Avoid touching your mouth, face, eyes, or nose. ? Cough or sneeze into a tissue or your sleeve or elbow instead of into your hand or into the air. Contact a health care provider if:  You are getting worse instead of better.  You have a fever or chills.  Your mucus is brown or red.  You have yellow or brown discharge coming from your nose.  You have pain in your face, especially when you bend forward.  You have swollen neck glands.  You have pain while swallowing.  You have white areas in the back of your throat. Get help right away if:  You have shortness of breath that gets worse.  You have severe or persistent: ? Headache. ? Ear pain. ? Sinus pain. ? Chest pain.  You have chronic lung disease along with any of the following: ? Wheezing. ? Prolonged cough. ? Coughing up blood. ? A change in your usual mucus.  You have a stiff neck.  You have changes in your: ? Vision. ? Hearing. ? Thinking. ? Mood. Summary  An upper respiratory infection (URI) is a common infection of the nose, throat, and upper air passages that lead to the lungs.  A URI is caused by a virus.  URIs usually get better on their own within 7-10 days.  Medicines cannot cure URIs, but your health care provider may recommend certain medicines to help relieve symptoms. This information is not intended to replace advice given to you by your health care provider. Make sure you discuss any questions you have with your health care provider. Document Revised: 06/18/2018 Document Reviewed: 01/24/2017 Elsevier Patient Education  [place].

## 2020-05-09 ENCOUNTER — Telehealth: Payer: Self-pay | Admitting: Nurse Practitioner

## 2020-05-09 LAB — RESPIRATORY VIRUS PANEL
Influenza A RNA: NOT DETECTED
Influenza B RNA: NOT DETECTED
RSV RNA: NOT DETECTED
hMPV: NOT DETECTED

## 2020-05-09 MED ORDER — CHERATUSSIN AC 100-10 MG/5ML PO SYRP
5.0000 mL | ORAL_SOLUTION | Freq: Three times a day (TID) | ORAL | 0 refills | Status: AC | PRN
Start: 2020-05-09 — End: 2020-05-16

## 2020-05-09 NOTE — Addendum Note (Signed)
Addended by: Leeanne Rio on: 05/09/2020 02:42 PM   Modules accepted: Orders

## 2020-05-09 NOTE — Addendum Note (Signed)
Addended by: Denice Paradise A on: 05/09/2020 09:25 AM   Modules accepted: Orders

## 2020-05-09 NOTE — Telephone Encounter (Signed)
Patient aware rx has been sent to pharmacy

## 2020-05-09 NOTE — Telephone Encounter (Signed)
Please call her and let her know I re ordered the Cheratussin - it was set as print. Now, it is ordered at her pharmacy.

## 2020-05-10 ENCOUNTER — Ambulatory Visit: Payer: BC Managed Care – PPO | Admitting: Podiatry

## 2020-06-01 ENCOUNTER — Other Ambulatory Visit: Payer: Self-pay | Admitting: Internal Medicine

## 2020-06-30 ENCOUNTER — Other Ambulatory Visit: Payer: Self-pay | Admitting: Internal Medicine

## 2020-07-29 ENCOUNTER — Other Ambulatory Visit: Payer: Self-pay | Admitting: Internal Medicine

## 2020-08-01 DIAGNOSIS — M9902 Segmental and somatic dysfunction of thoracic region: Secondary | ICD-10-CM | POA: Diagnosis not present

## 2020-08-01 DIAGNOSIS — M9903 Segmental and somatic dysfunction of lumbar region: Secondary | ICD-10-CM | POA: Diagnosis not present

## 2020-08-01 DIAGNOSIS — M5414 Radiculopathy, thoracic region: Secondary | ICD-10-CM | POA: Diagnosis not present

## 2020-08-01 DIAGNOSIS — M62830 Muscle spasm of back: Secondary | ICD-10-CM | POA: Diagnosis not present

## 2020-08-11 DIAGNOSIS — M5414 Radiculopathy, thoracic region: Secondary | ICD-10-CM | POA: Diagnosis not present

## 2020-08-11 DIAGNOSIS — M62830 Muscle spasm of back: Secondary | ICD-10-CM | POA: Diagnosis not present

## 2020-08-11 DIAGNOSIS — M9902 Segmental and somatic dysfunction of thoracic region: Secondary | ICD-10-CM | POA: Diagnosis not present

## 2020-08-11 DIAGNOSIS — M9903 Segmental and somatic dysfunction of lumbar region: Secondary | ICD-10-CM | POA: Diagnosis not present

## 2020-09-03 ENCOUNTER — Other Ambulatory Visit: Payer: Self-pay | Admitting: Internal Medicine

## 2020-09-11 DIAGNOSIS — M62830 Muscle spasm of back: Secondary | ICD-10-CM | POA: Diagnosis not present

## 2020-09-11 DIAGNOSIS — M9902 Segmental and somatic dysfunction of thoracic region: Secondary | ICD-10-CM | POA: Diagnosis not present

## 2020-09-11 DIAGNOSIS — M9903 Segmental and somatic dysfunction of lumbar region: Secondary | ICD-10-CM | POA: Diagnosis not present

## 2020-09-11 DIAGNOSIS — M5414 Radiculopathy, thoracic region: Secondary | ICD-10-CM | POA: Diagnosis not present

## 2020-10-10 DIAGNOSIS — M5414 Radiculopathy, thoracic region: Secondary | ICD-10-CM | POA: Diagnosis not present

## 2020-10-10 DIAGNOSIS — M62830 Muscle spasm of back: Secondary | ICD-10-CM | POA: Diagnosis not present

## 2020-10-10 DIAGNOSIS — M9902 Segmental and somatic dysfunction of thoracic region: Secondary | ICD-10-CM | POA: Diagnosis not present

## 2020-10-10 DIAGNOSIS — M9903 Segmental and somatic dysfunction of lumbar region: Secondary | ICD-10-CM | POA: Diagnosis not present

## 2020-12-05 DIAGNOSIS — M5414 Radiculopathy, thoracic region: Secondary | ICD-10-CM | POA: Diagnosis not present

## 2020-12-05 DIAGNOSIS — M9902 Segmental and somatic dysfunction of thoracic region: Secondary | ICD-10-CM | POA: Diagnosis not present

## 2020-12-05 DIAGNOSIS — M62830 Muscle spasm of back: Secondary | ICD-10-CM | POA: Diagnosis not present

## 2020-12-05 DIAGNOSIS — M9903 Segmental and somatic dysfunction of lumbar region: Secondary | ICD-10-CM | POA: Diagnosis not present

## 2021-01-02 DIAGNOSIS — M9902 Segmental and somatic dysfunction of thoracic region: Secondary | ICD-10-CM | POA: Diagnosis not present

## 2021-01-02 DIAGNOSIS — M9903 Segmental and somatic dysfunction of lumbar region: Secondary | ICD-10-CM | POA: Diagnosis not present

## 2021-01-02 DIAGNOSIS — M5414 Radiculopathy, thoracic region: Secondary | ICD-10-CM | POA: Diagnosis not present

## 2021-01-02 DIAGNOSIS — M62830 Muscle spasm of back: Secondary | ICD-10-CM | POA: Diagnosis not present

## 2021-02-06 DIAGNOSIS — M9902 Segmental and somatic dysfunction of thoracic region: Secondary | ICD-10-CM | POA: Diagnosis not present

## 2021-02-06 DIAGNOSIS — M62830 Muscle spasm of back: Secondary | ICD-10-CM | POA: Diagnosis not present

## 2021-02-06 DIAGNOSIS — M9903 Segmental and somatic dysfunction of lumbar region: Secondary | ICD-10-CM | POA: Diagnosis not present

## 2021-02-06 DIAGNOSIS — M5414 Radiculopathy, thoracic region: Secondary | ICD-10-CM | POA: Diagnosis not present

## 2021-02-06 DIAGNOSIS — M25531 Pain in right wrist: Secondary | ICD-10-CM | POA: Diagnosis not present

## 2021-02-06 DIAGNOSIS — M25532 Pain in left wrist: Secondary | ICD-10-CM | POA: Diagnosis not present

## 2021-03-06 DIAGNOSIS — M9902 Segmental and somatic dysfunction of thoracic region: Secondary | ICD-10-CM | POA: Diagnosis not present

## 2021-03-06 DIAGNOSIS — M62830 Muscle spasm of back: Secondary | ICD-10-CM | POA: Diagnosis not present

## 2021-03-06 DIAGNOSIS — M5414 Radiculopathy, thoracic region: Secondary | ICD-10-CM | POA: Diagnosis not present

## 2021-03-06 DIAGNOSIS — M25532 Pain in left wrist: Secondary | ICD-10-CM | POA: Diagnosis not present

## 2021-03-06 DIAGNOSIS — M9903 Segmental and somatic dysfunction of lumbar region: Secondary | ICD-10-CM | POA: Diagnosis not present

## 2021-03-06 DIAGNOSIS — M25531 Pain in right wrist: Secondary | ICD-10-CM | POA: Diagnosis not present

## 2021-04-03 DIAGNOSIS — M62830 Muscle spasm of back: Secondary | ICD-10-CM | POA: Diagnosis not present

## 2021-04-03 DIAGNOSIS — M25532 Pain in left wrist: Secondary | ICD-10-CM | POA: Diagnosis not present

## 2021-04-03 DIAGNOSIS — M5414 Radiculopathy, thoracic region: Secondary | ICD-10-CM | POA: Diagnosis not present

## 2021-04-03 DIAGNOSIS — M9903 Segmental and somatic dysfunction of lumbar region: Secondary | ICD-10-CM | POA: Diagnosis not present

## 2021-04-03 DIAGNOSIS — M25531 Pain in right wrist: Secondary | ICD-10-CM | POA: Diagnosis not present

## 2021-04-03 DIAGNOSIS — M9902 Segmental and somatic dysfunction of thoracic region: Secondary | ICD-10-CM | POA: Diagnosis not present

## 2021-05-04 DIAGNOSIS — M5414 Radiculopathy, thoracic region: Secondary | ICD-10-CM | POA: Diagnosis not present

## 2021-05-04 DIAGNOSIS — M62830 Muscle spasm of back: Secondary | ICD-10-CM | POA: Diagnosis not present

## 2021-05-04 DIAGNOSIS — M25531 Pain in right wrist: Secondary | ICD-10-CM | POA: Diagnosis not present

## 2021-05-04 DIAGNOSIS — M9903 Segmental and somatic dysfunction of lumbar region: Secondary | ICD-10-CM | POA: Diagnosis not present

## 2021-05-04 DIAGNOSIS — M9902 Segmental and somatic dysfunction of thoracic region: Secondary | ICD-10-CM | POA: Diagnosis not present

## 2021-05-04 DIAGNOSIS — M25532 Pain in left wrist: Secondary | ICD-10-CM | POA: Diagnosis not present

## 2021-05-09 ENCOUNTER — Ambulatory Visit: Payer: BC Managed Care – PPO | Admitting: Internal Medicine

## 2021-05-09 ENCOUNTER — Other Ambulatory Visit: Payer: Self-pay

## 2021-05-09 ENCOUNTER — Encounter: Payer: Self-pay | Admitting: Internal Medicine

## 2021-05-09 VITALS — BP 140/84 | HR 79 | Temp 96.4°F | Ht 68.0 in | Wt 207.4 lb

## 2021-05-09 DIAGNOSIS — M130 Polyarthritis, unspecified: Secondary | ICD-10-CM | POA: Diagnosis not present

## 2021-05-09 DIAGNOSIS — F4321 Adjustment disorder with depressed mood: Secondary | ICD-10-CM

## 2021-05-09 DIAGNOSIS — R030 Elevated blood-pressure reading, without diagnosis of hypertension: Secondary | ICD-10-CM | POA: Diagnosis not present

## 2021-05-09 DIAGNOSIS — R7301 Impaired fasting glucose: Secondary | ICD-10-CM

## 2021-05-09 DIAGNOSIS — Z1231 Encounter for screening mammogram for malignant neoplasm of breast: Secondary | ICD-10-CM | POA: Diagnosis not present

## 2021-05-09 DIAGNOSIS — R5383 Other fatigue: Secondary | ICD-10-CM

## 2021-05-09 DIAGNOSIS — Z0000 Encounter for general adult medical examination without abnormal findings: Secondary | ICD-10-CM

## 2021-05-09 DIAGNOSIS — E785 Hyperlipidemia, unspecified: Secondary | ICD-10-CM | POA: Diagnosis not present

## 2021-05-09 MED ORDER — MELOXICAM 15 MG PO TABS: ORAL_TABLET | ORAL | 1 refills | Status: DC

## 2021-05-09 NOTE — Assessment & Plan Note (Signed)
Ruling out RA given FH in [facility] and multiple joints affected

## 2021-05-09 NOTE — Patient Instructions (Addendum)
For the hip:  Resume meloxicam and stop any sources of aleve motrin and advil  You can add up to 2000 mg of acetominophen (tylenol) every day safely  In divided doses (500 mg every 6 hours  Or 1000 mg every 12 hours.)   If hip still a problem after 4 weeks call for referral to orthopedics for hip injection    Estroven  may work like black cohosh for the hot flashes

## 2021-05-09 NOTE — Assessment & Plan Note (Signed)
Secondary to loss of mother to pancreatic CA 6 weeks ago.

## 2021-05-09 NOTE — Assessment & Plan Note (Signed)

## 2021-05-09 NOTE — Progress Notes (Signed)
Patient ID: Alice Spencer, female    DOB: 02-05-1966  Age: 56 y.o. MRN: [id number]  The patient is here for annual preventive  examination and management of other chronic and acute problems.  This visit occurred during the SARS-CoV-2 public health emergency.  Safety protocols were in place, including screening questions prior to the visit, additional usage of staff PPE, and extensive cleaning of exam room while observing appropriate contact time as indicated for disinfecting solutions.     The risk factors are reflected in the social history.  The roster of all physicians providing medical care to patient - is listed in the Snapshot section of the chart.  Activities of daily living:  The patient is 100% independent in all ADLs: dressing, toileting, feeding as well as independent mobility  Home safety : The patient has smoke detectors in the home. They wear seatbelts.  There are no firearms at home. There is no violence in the home.   There is no risks for hepatitis, STDs or HIV. There is no   history of blood transfusion. They have no travel history to infectious disease endemic areas of the world.  The patient has seen their dentist in the last six month. They have seen their eye doctor in the last year. They admit to slight hearing difficulty with regard to whispered voices and some television programs.  They have deferred audiologic testing in the last year.  They do not  have excessive sun exposure. Discussed the need for sun protection: hats, long sleeves and use of sunscreen if there is significant sun exposure.   Diet: the importance of a healthy diet is discussed. They do have a healthy diet.  The benefits of regular aerobic exercise were discussed. She walks 4 times per week ,  20 minutes.   Depression screen: there are no signs or vegative symptoms of depression- irritability, change in appetite, anhedonia, sadness/tearfullness.  Cognitive assessment: the patient manages all their  financial and personal affairs and is actively engaged. They could relate day,date,year and events; recalled 2/3 objects at 3 minutes; performed clock-face test normally.  The following portions of the patient's history were reviewed and updated as appropriate: allergies, current medications, past family history, past medical history,  past surgical history, past social history  and problem list.  Visual acuity was not assessed per patient preference since she has regular follow up with her ophthalmologist. Hearing and body mass index were assessed and reviewed.   During the course of the visit the patient was educated and counseled about appropriate screening and preventive services including : fall prevention , diabetes screening, nutrition counseling, colorectal cancer screening, and recommended immunizations.    CC: The primary encounter diagnosis was Encounter for preventive health examination. Diagnoses of Fatigue, unspecified type, Hyperlipidemia, unspecified hyperlipidemia type, Elevated blood pressure reading, Impaired fasting glucose, Encounter for screening mammogram for malignant neoplasm of breast, Polyarthritis, Grief reaction, and Elevated blood pressure reading without diagnosis of hypertension were also pertinent to this visit.   1) right hip pain has returned for the last 6 months.  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  PT  done, helped a little  but too expensive.  2) Grief : mother died of pancreatic  CA 2 months ago.   3) severe bilateral thumb /MCP pain and elbow pain intermittent daily  .  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   She has a past surgical history that includes Tonsillectomy (1973); Breast surgery (Left,  2009); Colonoscopy with propofol (N/A, 11/08/2015); Breast biopsy (Left, 2014); and Breast excisional biopsy (Left, 09/28/2007).   Her family history includes Arthritis in her father and maternal grandfather; Cancer in her maternal grandfather, maternal uncle, maternal uncle, paternal aunt, and paternal grandfather; Diabetes in her father.She reports that she has never smoked. She has never used smokeless tobacco. She reports current alcohol use of about 2.0 standard drinks per week. She reports that she does not use drugs.  Outpatient Medications Prior to Visit  Medication Sig Dispense Refill   Black Cohosh 540 MG CAPS Take by mouth.     Cholecalciferol (VITAMIN D3) 50 MCG (2000 UT) CAPS Take by mouth.     predniSONE (DELTASONE) 10 MG tablet Take 6 tablets (60 mg total) on day 1; Take 5 tablets (50 mg total) on day 2; Take 4 tablets (40 mg total) on day 3 and continue to lower by 1 tablet daily until off. 21 tablet 0   azithromycin (ZITHROMAX) 250 MG tablet Take 2 tablets ( total 500 mg) PO on day 1, then take 1 tablet ( total 250 mg) by mouth q24h x 4 days. (Patient not taking: Reported on 05/09/2021) 6 tablet 0   meloxicam (MOBIC) 15 MG tablet TAKE 1 TABLET(15 MG) BY MOUTH DAILY (Patient not taking: Reported on 05/09/2021) 30 tablet 0   No facility-administered medications prior to visit.    Review of Systems  Patient denies headache, fevers, malaise, unintentional weight loss, skin rash, eye pain, sinus congestion and sinus pain, sore throat, dysphagia,  hemoptysis , cough, dyspnea, wheezing, chest pain, palpitations, orthopnea, edema, abdominal pain, nausea, melena, diarrhea, constipation, flank pain, dysuria, hematuria, urinary  Frequency, nocturia, numbness, tingling, seizures,  Focal weakness, Loss of consciousness,  Tremor, insomnia, depression, anxiety, and suicidal ideation.     Objective:  BP 140/84 (BP Location: Right Arm, Patient Position: Sitting, Cuff Size: Large)   Pulse 79    Temp (!) 96.4 F (35.8 C) (Temporal)   Ht 5\' 8"  (1.727 m)   Wt 207 lb 6.4 oz (94.1 kg)   SpO2 99%   BMI 31.54 kg/m   Physical Exam  General appearance: alert, cooperative and appears stated age Head: Normocephalic, without obvious abnormality, atraumatic Eyes: conjunctivae/corneas clear. PERRL, EOM's intact. Fundi benign. Ears: normal TM's and external ear canals both ears Nose: Nares normal. Septum midline. Mucosa normal. No drainage or sinus tenderness. Throat: lips, mucosa, and tongue normal; teeth and gums normal Neck: no adenopathy, no carotid bruit, no JVD, supple, symmetrical, trachea midline and thyroid not enlarged, symmetric, no tenderness/mass/nodules Lungs: clear to auscultation bilaterally Breasts: normal appearance, no masses or tenderness Heart: regular rate and rhythm, S1, S2 normal, no murmur, click, rub or gallop Abdomen: soft, non-tender; bowel sounds normal; no masses,  no organomegaly Extremities: extremities normal, atraumatic, no cyanosis or edema Pulses: 2+ and symmetric Skin: Skin color, texture, turgor normal. No rashes or lesions Neurologic: Alert and oriented X 3, normal strength and tone. Normal symmetric reflexes. Normal coordination and gait.     Assessment & Plan:   Problem List Items Addressed This Visit     Encounter for preventive health examination - Primary    age appropriate education and counseling updated, referrals for preventative services and immunizations addressed, dietary and smoking counseling addressed,  most recent labs reviewed.  I have personally reviewed and have noted:   1) the patient's medical and social history 2) The pt's use of alcohol, tobacco, and illicit drugs 3) The patient's current medications and supplements 4) Functional ability including ADL's, fall risk, home safety risk, hearing and visual impairment 5) Diet and physical activities 6) Evidence for depression or mood disorder 7) The patient's height, weight, and  BMI have been recorded in the chart   I have made referrals, and provided counseling and education based on review of the above      Grief reaction    Secondary to loss of mother to pancreatic CA 6 weeks ago.       Polyarthritis    Ruling out RA given FH in [facility] and multiple joints affected       Relevant Orders   Sedimentation rate   C-reactive protein   Rheumatoid Factor   Elevated blood pressure reading without diagnosis of hypertension    She has no history of hypertension but has had an elevated reading.  She has been asked to check her pressures at home and submit readings for evaluation. Renal function will be checked today      Other Visit Diagnoses     Fatigue, unspecified type       Relevant Orders   CBC with Differential/Platelet   TSH   Hyperlipidemia, unspecified hyperlipidemia type       Relevant Orders   Lipid Profile   Elevated blood pressure reading       Relevant Orders   Comprehensive metabolic panel   Urine Microalbumin w/creat. ratio   Impaired fasting glucose       Relevant Orders   Comprehensive metabolic panel   HgB W4R   Encounter for screening mammogram for malignant neoplasm of breast       Relevant Orders   MM 3D SCREEN BREAST BILATERAL       I have discontinued Neira C. Laverne's vitamin D3, Black Cohosh, azithromycin, and predniSONE. I am also having her maintain her meloxicam.  Meds ordered this encounter  Medications   meloxicam (MOBIC) 15 MG tablet    Sig: TAKE 1 TABLET(15 MG) BY MOUTH DAILY    Dispense:  90 tablet    Refill:  1    Medications Discontinued During This Encounter  Medication Reason   azithromycin (ZITHROMAX) 250 MG tablet    Black Cohosh 540 MG CAPS    Cholecalciferol (VITAMIN D3) 50 MCG (2000 UT) CAPS    predniSONE (DELTASONE) 10 MG tablet    meloxicam (MOBIC) 15 MG tablet Reorder    Follow-up: No follow-ups on file.   Crecencio Mc, MD

## 2021-05-09 NOTE — Assessment & Plan Note (Signed)
She has no history of hypertension but has had an elevated reading.  She has been asked to check her pressures at home and submit readings for evaluation. Renal function will be checked today

## 2021-05-10 LAB — SEDIMENTATION RATE: Sed Rate: 10 mm/hr (ref 0–30)

## 2021-05-10 LAB — LIPID PANEL
Cholesterol: 225 mg/dL — ABNORMAL HIGH (ref 0–200)
HDL: 63.4 mg/dL (ref >39.00)
LDL Cholesterol: 137 mg/dL — ABNORMAL HIGH (ref 0–99)
NonHDL: 161.59
Total CHOL/HDL Ratio: 4
Triglycerides: 123 mg/dL (ref 0.0–149.0)
VLDL: 24.6 mg/dL (ref 0.0–40.0)

## 2021-05-10 LAB — MICROALBUMIN / CREATININE URINE RATIO
Creatinine,U: 12.1 mg/dL
Microalb Creat Ratio: 5.8 mg/g (ref 0.0–30.0)
Microalb, Ur: <0.7 mg/dL (ref 0.0–1.9)

## 2021-05-10 LAB — CBC WITH DIFFERENTIAL/PLATELET
Basophils Absolute: 0 10*3/uL (ref 0.0–0.1)
Basophils Relative: 0.9 % (ref 0.0–3.0)
Eosinophils Absolute: 0.1 10*3/uL (ref 0.0–0.7)
Eosinophils Relative: 2 % (ref 0.0–5.0)
HCT: 37.7 % (ref 36.0–46.0)
Hemoglobin: 12.8 g/dL (ref 12.0–15.0)
Lymphocytes Relative: 25.4 % (ref 12.0–46.0)
Lymphs Abs: 1.5 10*3/uL (ref 0.7–4.0)
MCHC: 33.9 g/dL (ref 30.0–36.0)
MCV: 91.3 fl (ref 78.0–100.0)
Monocytes Absolute: 0.5 10*3/uL (ref 0.1–1.0)
Monocytes Relative: 7.9 % (ref 3.0–12.0)
Neutro Abs: 3.7 10*3/uL (ref 1.4–7.7)
Neutrophils Relative %: 63.8 % (ref 43.0–77.0)
Platelets: 291 10*3/uL (ref 150.0–400.0)
RBC: 4.13 Mil/uL (ref 3.87–5.11)
RDW: 12.1 % (ref 11.5–15.5)
WBC: 5.8 10*3/uL (ref 4.0–10.5)

## 2021-05-10 LAB — COMPREHENSIVE METABOLIC PANEL
ALT: 16 U/L (ref 0–35)
AST: 16 U/L (ref 0–37)
Albumin: 4.6 g/dL (ref 3.5–5.2)
Alkaline Phosphatase: 97 U/L (ref 39–117)
BUN: 18 mg/dL (ref 6–23)
CO2: 29 mEq/L (ref 19–32)
Calcium: 9.8 mg/dL (ref 8.4–10.5)
Chloride: 103 mEq/L (ref 96–112)
Creatinine, Ser: 0.76 mg/dL (ref 0.40–1.20)
GFR: 88.1 mL/min (ref >60.00)
Glucose, Bld: 77 mg/dL (ref 70–99)
Potassium: 4.2 mEq/L (ref 3.5–5.1)
Sodium: 141 mEq/L (ref 135–145)
Total Bilirubin: 0.5 mg/dL (ref 0.2–1.2)
Total Protein: 7.1 g/dL (ref 6.0–8.3)

## 2021-05-10 LAB — RHEUMATOID FACTOR: Rheumatoid fact SerPl-aCnc: <14 IU/mL (ref <14)

## 2021-05-10 LAB — TSH: TSH: 2.14 u[IU]/mL (ref 0.35–5.50)

## 2021-05-10 LAB — HEMOGLOBIN A1C: Hgb A1c MFr Bld: 5.7 % (ref 4.6–6.5)

## 2021-05-10 LAB — C-REACTIVE PROTEIN: CRP: <1 mg/dL (ref 0.5–20.0)

## 2021-05-31 ENCOUNTER — Encounter: Payer: Self-pay | Admitting: Internal Medicine

## 2021-05-31 DIAGNOSIS — G8929 Other chronic pain: Secondary | ICD-10-CM

## 2021-06-05 DIAGNOSIS — M5414 Radiculopathy, thoracic region: Secondary | ICD-10-CM | POA: Diagnosis not present

## 2021-06-05 DIAGNOSIS — M25531 Pain in right wrist: Secondary | ICD-10-CM | POA: Diagnosis not present

## 2021-06-05 DIAGNOSIS — M9903 Segmental and somatic dysfunction of lumbar region: Secondary | ICD-10-CM | POA: Diagnosis not present

## 2021-06-05 DIAGNOSIS — M25532 Pain in left wrist: Secondary | ICD-10-CM | POA: Diagnosis not present

## 2021-06-05 DIAGNOSIS — M62830 Muscle spasm of back: Secondary | ICD-10-CM | POA: Diagnosis not present

## 2021-06-05 DIAGNOSIS — M9902 Segmental and somatic dysfunction of thoracic region: Secondary | ICD-10-CM | POA: Diagnosis not present

## 2021-06-07 DIAGNOSIS — M25551 Pain in right hip: Secondary | ICD-10-CM | POA: Diagnosis not present

## 2021-06-07 DIAGNOSIS — M1611 Unilateral primary osteoarthritis, right hip: Secondary | ICD-10-CM | POA: Diagnosis not present

## 2021-06-21 DIAGNOSIS — S61011A Laceration without foreign body of right thumb without damage to nail, initial encounter: Secondary | ICD-10-CM | POA: Diagnosis not present

## 2021-06-21 DIAGNOSIS — Z23 Encounter for immunization: Secondary | ICD-10-CM | POA: Diagnosis not present

## 2021-07-04 DIAGNOSIS — M1611 Unilateral primary osteoarthritis, right hip: Secondary | ICD-10-CM | POA: Diagnosis not present

## 2021-07-11 DIAGNOSIS — M1611 Unilateral primary osteoarthritis, right hip: Secondary | ICD-10-CM | POA: Diagnosis not present

## 2021-07-24 DIAGNOSIS — R262 Difficulty in walking, not elsewhere classified: Secondary | ICD-10-CM | POA: Diagnosis not present

## 2021-07-30 ENCOUNTER — Encounter: Payer: Self-pay | Admitting: Internal Medicine

## 2021-07-30 DIAGNOSIS — M1611 Unilateral primary osteoarthritis, right hip: Secondary | ICD-10-CM | POA: Diagnosis not present

## 2021-07-30 DIAGNOSIS — Z01818 Encounter for other preprocedural examination: Secondary | ICD-10-CM | POA: Diagnosis not present

## 2021-07-30 DIAGNOSIS — M25551 Pain in right hip: Secondary | ICD-10-CM | POA: Diagnosis not present

## 2021-07-31 ENCOUNTER — Other Ambulatory Visit: Payer: Self-pay

## 2021-07-31 ENCOUNTER — Ambulatory Visit
Admission: RE | Admit: 2021-07-31 | Discharge: 2021-07-31 | Disposition: A | Discharge status: Home / Self Care | Payer: BC Managed Care – PPO | Source: Ambulatory Visit | Attending: Internal Medicine | Admitting: Internal Medicine

## 2021-07-31 DIAGNOSIS — Z1231 Encounter for screening mammogram for malignant neoplasm of breast: Secondary | ICD-10-CM | POA: Insufficient documentation

## 2021-08-06 DIAGNOSIS — M1611 Unilateral primary osteoarthritis, right hip: Secondary | ICD-10-CM | POA: Diagnosis not present

## 2021-08-09 DIAGNOSIS — M25551 Pain in right hip: Secondary | ICD-10-CM | POA: Diagnosis not present

## 2021-08-09 DIAGNOSIS — M25651 Stiffness of right hip, not elsewhere classified: Secondary | ICD-10-CM | POA: Diagnosis not present

## 2021-08-14 DIAGNOSIS — M25651 Stiffness of right hip, not elsewhere classified: Secondary | ICD-10-CM | POA: Diagnosis not present

## 2021-08-14 DIAGNOSIS — M25551 Pain in right hip: Secondary | ICD-10-CM | POA: Diagnosis not present

## 2021-08-17 DIAGNOSIS — M25551 Pain in right hip: Secondary | ICD-10-CM | POA: Diagnosis not present

## 2021-08-17 DIAGNOSIS — M25651 Stiffness of right hip, not elsewhere classified: Secondary | ICD-10-CM | POA: Diagnosis not present

## 2021-08-21 DIAGNOSIS — M25551 Pain in right hip: Secondary | ICD-10-CM | POA: Diagnosis not present

## 2021-08-21 DIAGNOSIS — M25651 Stiffness of right hip, not elsewhere classified: Secondary | ICD-10-CM | POA: Diagnosis not present

## 2021-08-24 DIAGNOSIS — M25551 Pain in right hip: Secondary | ICD-10-CM | POA: Diagnosis not present

## 2021-08-24 DIAGNOSIS — M25651 Stiffness of right hip, not elsewhere classified: Secondary | ICD-10-CM | POA: Diagnosis not present

## 2021-08-27 DIAGNOSIS — M25551 Pain in right hip: Secondary | ICD-10-CM | POA: Diagnosis not present

## 2021-08-27 DIAGNOSIS — M25651 Stiffness of right hip, not elsewhere classified: Secondary | ICD-10-CM | POA: Diagnosis not present

## 2021-08-30 DIAGNOSIS — M25651 Stiffness of right hip, not elsewhere classified: Secondary | ICD-10-CM | POA: Diagnosis not present

## 2021-08-30 DIAGNOSIS — M25551 Pain in right hip: Secondary | ICD-10-CM | POA: Diagnosis not present

## 2021-09-04 DIAGNOSIS — M25551 Pain in right hip: Secondary | ICD-10-CM | POA: Diagnosis not present

## 2021-09-04 DIAGNOSIS — M25651 Stiffness of right hip, not elsewhere classified: Secondary | ICD-10-CM | POA: Diagnosis not present

## 2021-09-11 DIAGNOSIS — M25551 Pain in right hip: Secondary | ICD-10-CM | POA: Diagnosis not present

## 2021-09-11 DIAGNOSIS — M25651 Stiffness of right hip, not elsewhere classified: Secondary | ICD-10-CM | POA: Diagnosis not present

## 2021-09-14 DIAGNOSIS — H35371 Puckering of macula, right eye: Secondary | ICD-10-CM | POA: Diagnosis not present

## 2021-09-14 DIAGNOSIS — H43813 Vitreous degeneration, bilateral: Secondary | ICD-10-CM | POA: Diagnosis not present

## 2021-09-14 DIAGNOSIS — H5213 Myopia, bilateral: Secondary | ICD-10-CM | POA: Diagnosis not present

## 2021-09-14 DIAGNOSIS — H33011 Retinal detachment with single break, right eye: Secondary | ICD-10-CM | POA: Diagnosis not present

## 2021-09-18 DIAGNOSIS — M25651 Stiffness of right hip, not elsewhere classified: Secondary | ICD-10-CM | POA: Diagnosis not present

## 2021-09-18 DIAGNOSIS — M25551 Pain in right hip: Secondary | ICD-10-CM | POA: Diagnosis not present

## 2021-09-20 DIAGNOSIS — D2372 Other benign neoplasm of skin of left lower limb, including hip: Secondary | ICD-10-CM | POA: Diagnosis not present

## 2021-09-20 DIAGNOSIS — L821 Other seborrheic keratosis: Secondary | ICD-10-CM | POA: Diagnosis not present

## 2021-11-02 ENCOUNTER — Other Ambulatory Visit: Payer: Self-pay | Admitting: Internal Medicine

## 2021-12-13 IMAGING — MG DIGITAL SCREENING BILAT W/ TOMO W/ CAD
6 of 10 series · 6 of 30 positions shown · non-contrast
Comparison: Previous exam(s).

CLINICAL DATA: Screening.

EXAM:
DIGITAL SCREENING BILATERAL MAMMOGRAM WITH TOMO AND CAD

[L CC synth-2D]
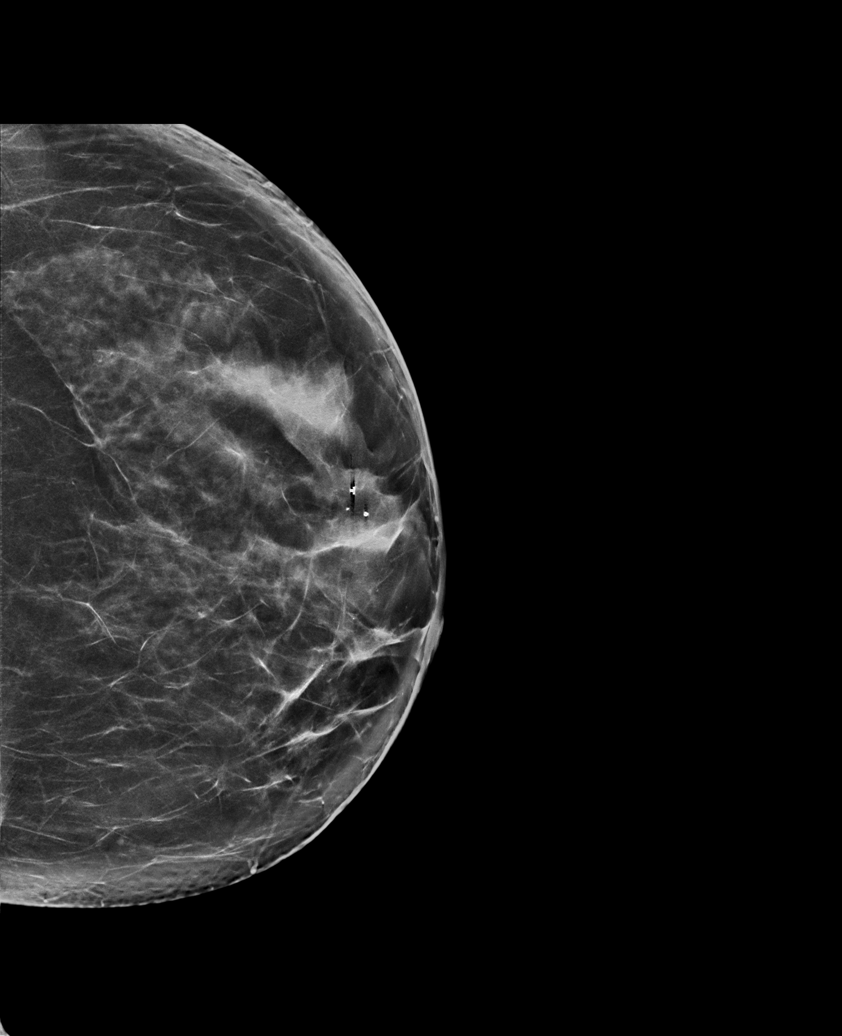

[L MLO synth-2D]
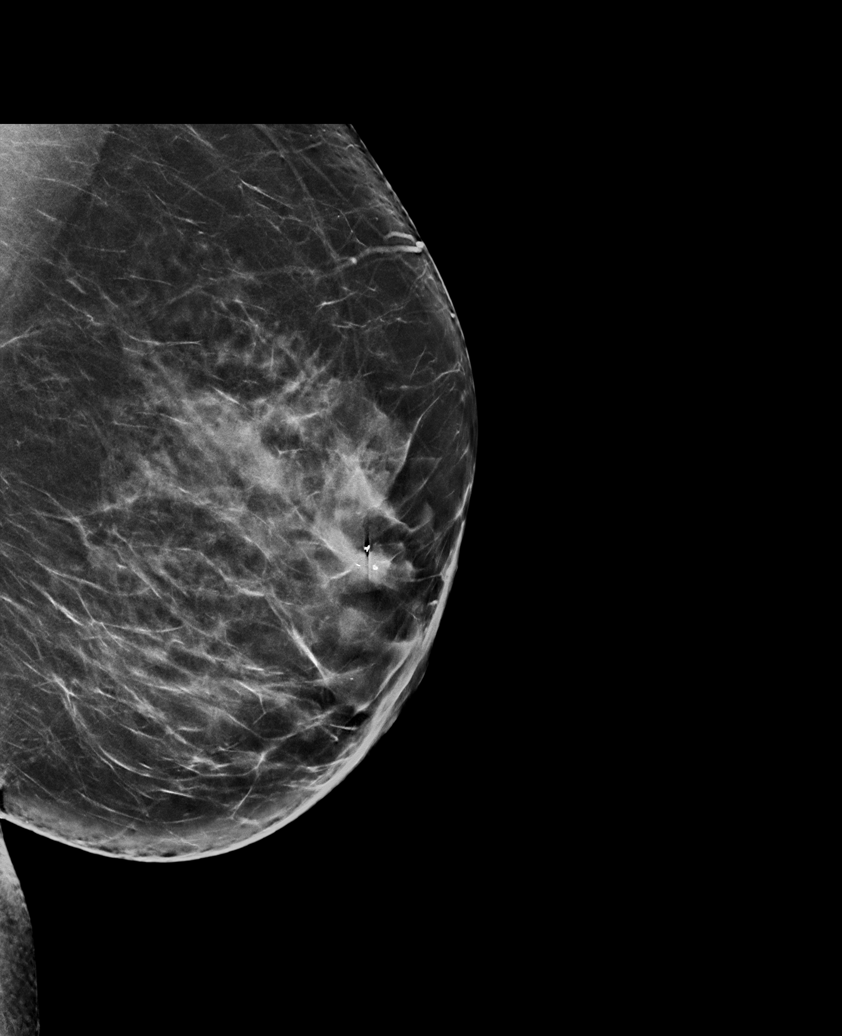

[R CC synth-2D]
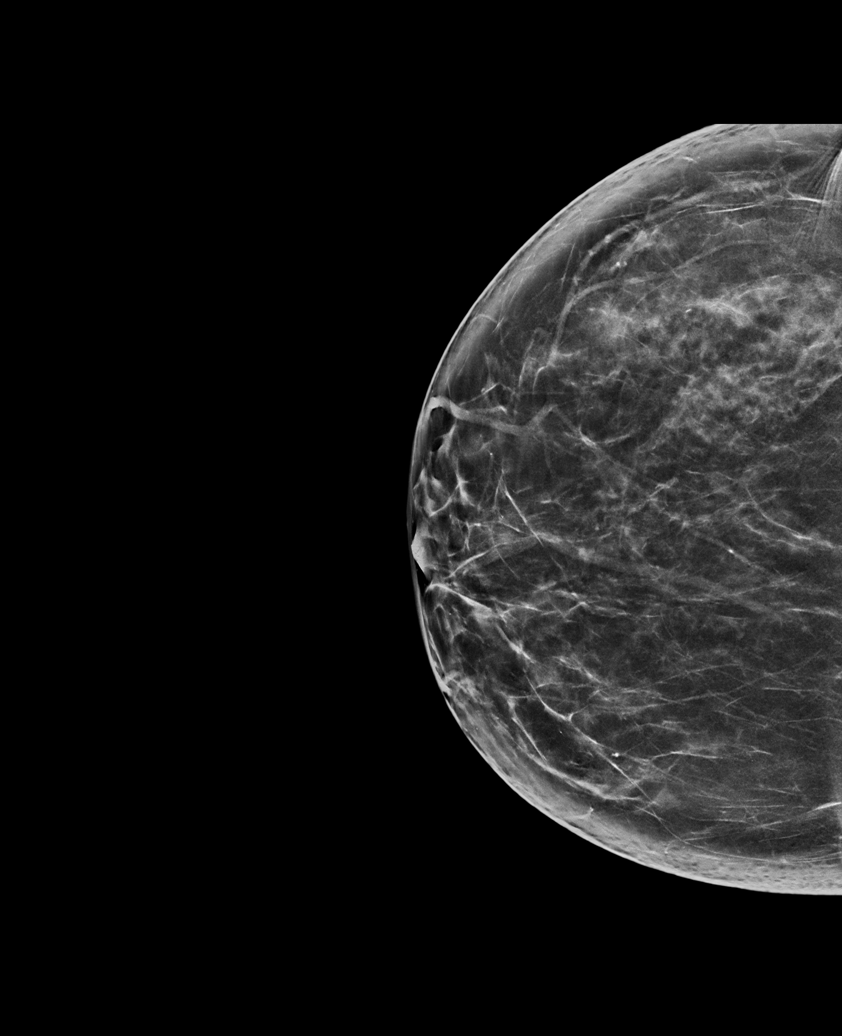

[R MLO synth-2D]
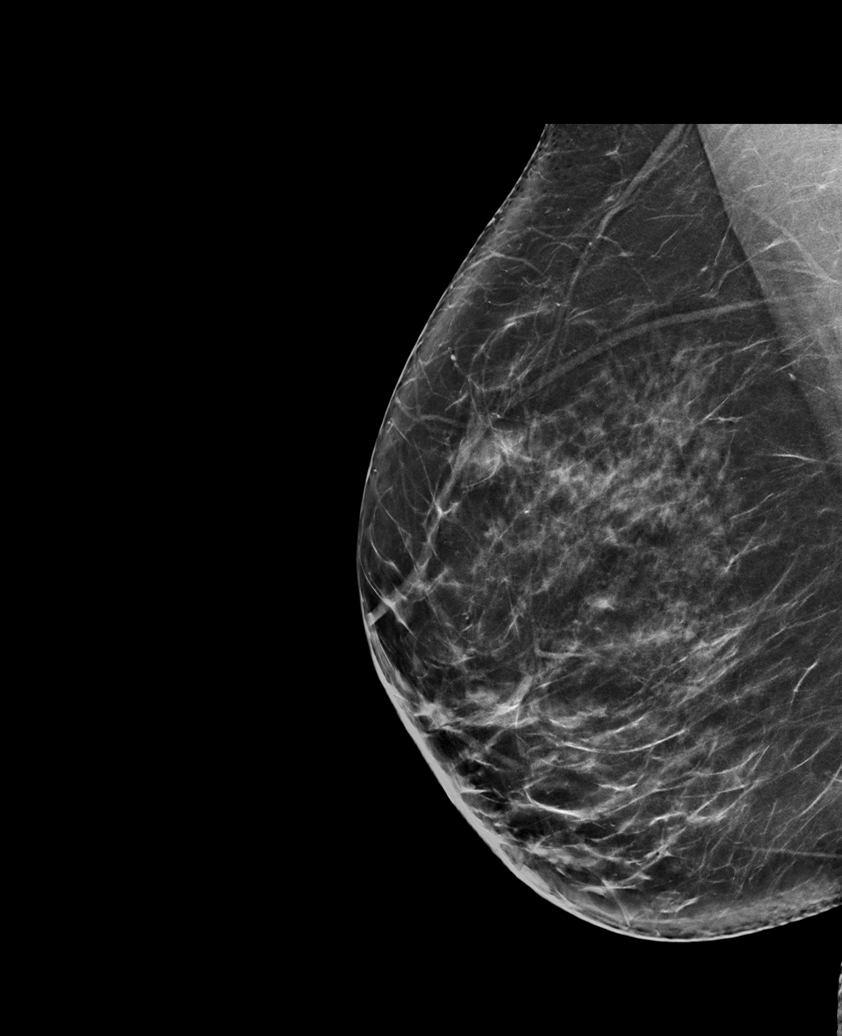

[R XCCL synth-2D]
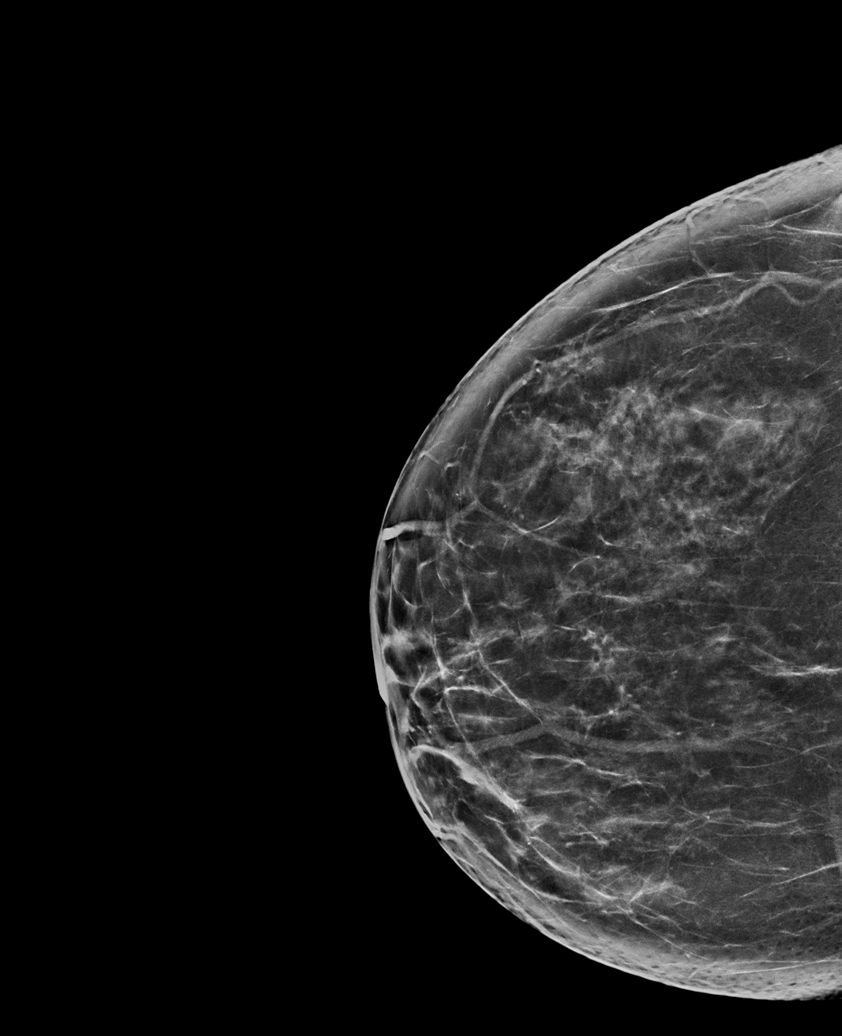

[R MLO tomo · tomo slice 41/82.0]
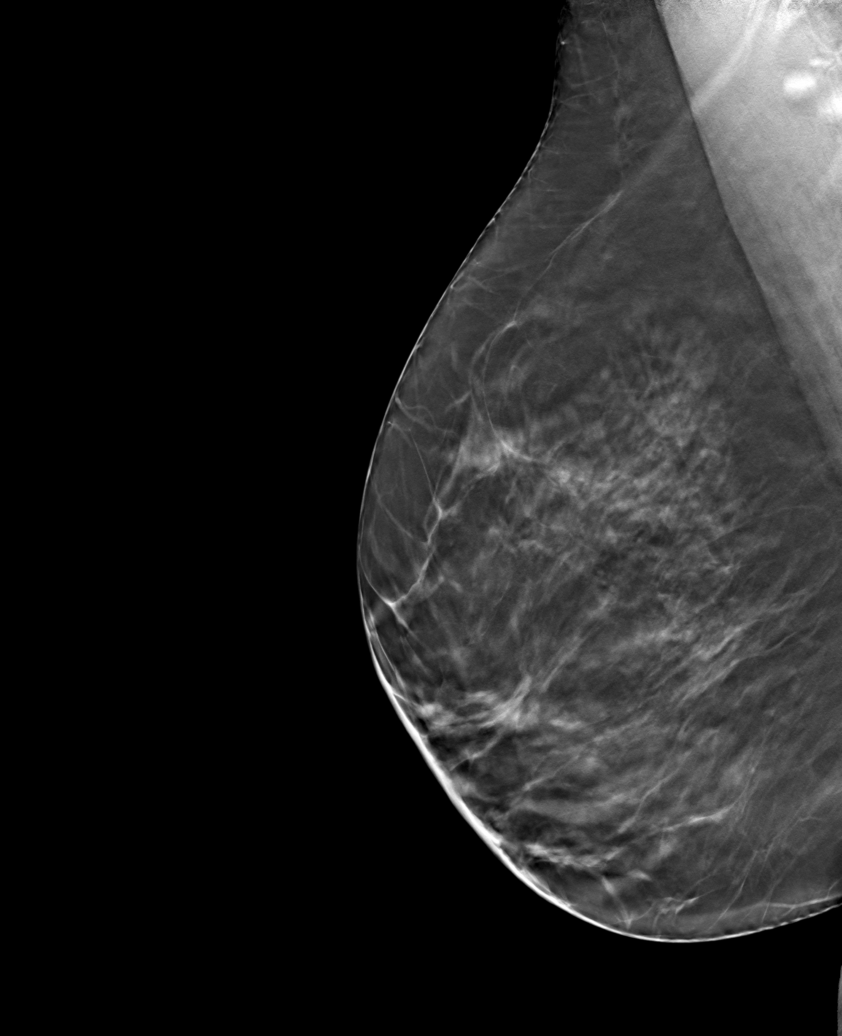

[6 of 30 positions shown; findings below may reference images not displayed]

ACR Breast Density Category c: The breast tissue is heterogeneously
dense, which may obscure small masses.
FINDINGS: There are no findings suspicious for malignancy. Images were
processed with CAD.
IMPRESSION: No mammographic evidence of malignancy. A result letter of this
screening mammogram will be mailed directly to the patient.

RECOMMENDATION:
Screening mammogram in one year. (Code:[id number]-U-LHB)

BI-RADS CATEGORY  1: Negative.

## 2021-12-27 DIAGNOSIS — H33011 Retinal detachment with single break, right eye: Secondary | ICD-10-CM | POA: Diagnosis not present

## 2021-12-27 DIAGNOSIS — H35371 Puckering of macula, right eye: Secondary | ICD-10-CM | POA: Diagnosis not present

## 2021-12-27 DIAGNOSIS — H43813 Vitreous degeneration, bilateral: Secondary | ICD-10-CM | POA: Diagnosis not present

## 2021-12-27 DIAGNOSIS — H35341 Macular cyst, hole, or pseudohole, right eye: Secondary | ICD-10-CM | POA: Diagnosis not present

## 2022-05-07 ENCOUNTER — Other Ambulatory Visit: Payer: Self-pay | Admitting: Internal Medicine

## 2022-05-10 NOTE — Telephone Encounter (Signed)
Called pt because she has not been seen in a year. Pt scheduled an appt and stated that she does not need the refill on the meloxicam.

## 2022-05-22 ENCOUNTER — Telehealth: Payer: Self-pay | Admitting: Internal Medicine

## 2022-05-22 DIAGNOSIS — R030 Elevated blood-pressure reading, without diagnosis of hypertension: Secondary | ICD-10-CM

## 2022-05-22 DIAGNOSIS — E782 Mixed hyperlipidemia: Secondary | ICD-10-CM

## 2022-05-22 DIAGNOSIS — E559 Vitamin D deficiency, unspecified: Secondary | ICD-10-CM

## 2022-05-22 NOTE — Telephone Encounter (Signed)
Patient has a lab appointment 05/28/2022, there are no orders in.

## 2022-05-22 NOTE — Addendum Note (Signed)
Addended by: Crecencio Mc on: 05/22/2022 03:40 PM   Modules accepted: Orders

## 2022-05-22 NOTE — Telephone Encounter (Signed)
Orders placed.

## 2022-05-28 ENCOUNTER — Other Ambulatory Visit (INDEPENDENT_AMBULATORY_CARE_PROVIDER_SITE_OTHER): Payer: BC Managed Care – PPO

## 2022-05-28 DIAGNOSIS — E782 Mixed hyperlipidemia: Secondary | ICD-10-CM

## 2022-05-28 DIAGNOSIS — R030 Elevated blood-pressure reading, without diagnosis of hypertension: Secondary | ICD-10-CM

## 2022-05-28 DIAGNOSIS — E559 Vitamin D deficiency, unspecified: Secondary | ICD-10-CM

## 2022-05-28 LAB — COMPREHENSIVE METABOLIC PANEL
ALT: 18 U/L (ref 0–35)
AST: 16 U/L (ref 0–37)
Albumin: 4.5 g/dL (ref 3.5–5.2)
Alkaline Phosphatase: 105 U/L (ref 39–117)
BUN: 19 mg/dL (ref 6–23)
CO2: 28 mEq/L (ref 19–32)
Calcium: 9.5 mg/dL (ref 8.4–10.5)
Chloride: 104 mEq/L (ref 96–112)
Creatinine, Ser: 0.85 mg/dL (ref 0.40–1.20)
GFR: 76.46 mL/min (ref >60.00)
Glucose, Bld: 97 mg/dL (ref 70–99)
Potassium: 4.4 mEq/L (ref 3.5–5.1)
Sodium: 141 mEq/L (ref 135–145)
Total Bilirubin: 0.8 mg/dL (ref 0.2–1.2)
Total Protein: 7 g/dL (ref 6.0–8.3)

## 2022-05-28 LAB — LIPID PANEL
Cholesterol: 254 mg/dL — ABNORMAL HIGH (ref 0–200)
HDL: 75 mg/dL (ref >39.00)
LDL Cholesterol: 155 mg/dL — ABNORMAL HIGH (ref 0–99)
NonHDL: 178.92
Total CHOL/HDL Ratio: 3
Triglycerides: 119 mg/dL (ref 0.0–149.0)
VLDL: 23.8 mg/dL (ref 0.0–40.0)

## 2022-05-28 LAB — MICROALBUMIN / CREATININE URINE RATIO
Creatinine,U: 101 mg/dL
Microalb Creat Ratio: 0.7 mg/g (ref 0.0–30.0)
Microalb, Ur: <0.7 mg/dL (ref 0.0–1.9)

## 2022-05-28 LAB — VITAMIN D 25 HYDROXY (VIT D DEFICIENCY, FRACTURES): VITD: 34.98 ng/mL (ref 30.00–100.00)

## 2022-05-31 ENCOUNTER — Ambulatory Visit (INDEPENDENT_AMBULATORY_CARE_PROVIDER_SITE_OTHER): Payer: BC Managed Care – PPO | Admitting: Internal Medicine

## 2022-05-31 ENCOUNTER — Encounter: Payer: Self-pay | Admitting: Internal Medicine

## 2022-05-31 VITALS — BP 118/74 | HR 80 | Temp 97.9°F | Ht 68.0 in | Wt 197.8 lb

## 2022-05-31 DIAGNOSIS — H33011 Retinal detachment with single break, right eye: Secondary | ICD-10-CM | POA: Diagnosis not present

## 2022-05-31 DIAGNOSIS — M25551 Pain in right hip: Secondary | ICD-10-CM

## 2022-05-31 DIAGNOSIS — R030 Elevated blood-pressure reading, without diagnosis of hypertension: Secondary | ICD-10-CM | POA: Diagnosis not present

## 2022-05-31 DIAGNOSIS — E663 Overweight: Secondary | ICD-10-CM

## 2022-05-31 DIAGNOSIS — Z1231 Encounter for screening mammogram for malignant neoplasm of breast: Secondary | ICD-10-CM

## 2022-05-31 DIAGNOSIS — Z0001 Encounter for general adult medical examination with abnormal findings: Secondary | ICD-10-CM

## 2022-05-31 DIAGNOSIS — G8929 Other chronic pain: Secondary | ICD-10-CM

## 2022-05-31 DIAGNOSIS — M1611 Unilateral primary osteoarthritis, right hip: Secondary | ICD-10-CM

## 2022-05-31 DIAGNOSIS — Z0000 Encounter for general adult medical examination without abnormal findings: Secondary | ICD-10-CM

## 2022-05-31 MED ORDER — MUPIROCIN 2 % EX OINT: 1.0000 | TOPICAL_OINTMENT | Freq: Two times a day (BID) | CUTANEOUS | 0 refills | Status: DC

## 2022-05-31 NOTE — Progress Notes (Signed)
The patient is here for annual preventive examination and management of other chronic and acute problems.   The risk factors are reflected in the social history.   The roster of all physicians providing medical care to patient - is listed in the Snapshot section of the chart.   Activities of daily living:  The patient is 100% independent in all ADLs: dressing, toileting, feeding as well as independent mobility   Home safety : The patient has smoke detectors in the home. They wear seatbelts.  There are no unsecured firearms at home. There is no violence in the home.    There is no risks for hepatitis, STDs or HIV. There is no   history of blood transfusion. They have no travel history to infectious disease endemic areas of the world.   The patient has seen their dentist in the last six month. They have seen their eye doctor in the last year. The patinet  denies slight hearing difficulty with regard to whispered voices and some television programs.  They have deferred audiologic testing in the last year.  They do not  have excessive sun exposure. Discussed the need for sun protection: hats, long sleeves and use of sunscreen if there is significant sun exposure.    Diet: the importance of a healthy diet is discussed. They do have a healthy diet.   The benefits of regular aerobic exercise were discussed. The patient  exercises  3 to 5 days per week  for  60 minutes.    Depression screen: there are no signs or vegative symptoms of depression- irritability, change in appetite, anhedonia, sadness/tearfullness.   The following portions of the patient's history were reviewed and updated as appropriate: allergies, current medications, past family history, past medical history,  past surgical history, past social history  and problem list.   Visual acuity was not assessed per patient preference since the patient has regular follow up with an  ophthalmologist. Hearing and body mass index were assessed and  reviewed.    During the course of the visit the patient was educated and counseled about appropriate screening and preventive services including : fall prevention , diabetes screening, nutrition counseling, colorectal cancer screening, and recommended immunizations.    Chief Complaint:  Adjusting to life without mother (first year) . 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  In 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  Pain resolved   Has joined [employer] recently with husband.  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    Physical Exam:  BP (!) 148/82   Pulse 80   Temp 97.9 F (36.6 C) (Oral)   Ht '5\' 8"'$  (1.727 m)   Wt 197 lb 12.8 oz (89.7 kg)   LMP 04/30/2019   SpO2 98%   BMI 30.08 kg/m    General appearance: alert, cooperative and appears stated age Head: Normocephalic, without obvious abnormality, atraumatic Eyes: conjunctivae/corneas clear. PERRL, EOM's intact. Fundi benign. Ears: normal TM's and external ear canals both ears Nose: Nares normal. Septum  midline. Mucosa normal. No drainage or sinus tenderness. Throat: lips, mucosa, and tongue normal; teeth and gums normal Neck: no adenopathy, no carotid bruit, no JVD, supple, symmetrical, trachea midline and thyroid not enlarged, symmetric, no tenderness/mass/nodules Lungs: clear to auscultation bilaterally Breasts: normal appearance, no masses or  tenderness Heart: regular rate and rhythm, S1, S2 normal, no murmur, click, rub or gallop Abdomen: soft, non-tender; bowel sounds normal; no masses,  no organomegaly Extremities: extremities normal, atraumatic, no cyanosis or edema Pulses: 2+ and symmetric Skin: Skin color, texture, turgor normal. No rashes or lesions Neurologic: Alert and oriented X 3, normal strength and tone. Normal symmetric reflexes. Normal coordination and gait.     Assessment and Plan:  No problem-specific Assessment & Plan notes found for this encounter.   Updated Medication List Outpatient Encounter Medications as of 05/31/2022  Medication Sig   meloxicam (MOBIC) 15 MG tablet TAKE 1 TABLET(15 MG) BY MOUTH DAILY   No facility-administered encounter medications on file as of 05/31/2022.

## 2022-05-31 NOTE — Assessment & Plan Note (Signed)
Treated at [facility] July 2023

## 2022-05-31 NOTE — Patient Instructions (Signed)
Mupirocin ointment  apply to inside of nostril with q tip twice daily for 10 days  If sore is still present, agree with seeing ENT  Mammogram ordered   Labs look great

## 2022-06-02 NOTE — Assessment & Plan Note (Signed)
She has no history of hypertension but had an  elevated office readings last year.  Today's reading is normal   She has been asked to check her pressures at home and submit readings for evaluation. Renal function is normal  Lab Results  Component Value Date   CREATININE 0.85 05/28/2022   Lab Results  Component Value Date   NA 141 05/28/2022   K 4.4 05/28/2022   CL 104 05/28/2022   CO2 28 05/28/2022

## 2022-06-02 NOTE — Assessment & Plan Note (Signed)
Resolve s/p THR

## 2022-06-02 NOTE — Assessment & Plan Note (Signed)

## 2022-06-02 NOTE — Assessment & Plan Note (Signed)
I have congratulated her in reduction of   BMI and encouraged  Continued  Efforts to reach goal of 160 lbs using a low glycemic index diet and regular exercise a minimum of 5 days per week.

## 2022-07-09 DIAGNOSIS — M545 Low back pain, unspecified: Secondary | ICD-10-CM | POA: Diagnosis not present

## 2022-08-01 ENCOUNTER — Ambulatory Visit
Admission: RE | Admit: 2022-08-01 | Discharge: 2022-08-01 | Disposition: A | Discharge status: Home / Self Care | Payer: BC Managed Care – PPO | Source: Ambulatory Visit | Attending: Internal Medicine | Admitting: Internal Medicine

## 2022-08-01 DIAGNOSIS — Z1231 Encounter for screening mammogram for malignant neoplasm of breast: Secondary | ICD-10-CM | POA: Diagnosis not present

## 2022-09-17 DIAGNOSIS — H33011 Retinal detachment with single break, right eye: Secondary | ICD-10-CM | POA: Diagnosis not present

## 2022-10-23 DIAGNOSIS — L578 Other skin changes due to chronic exposure to nonionizing radiation: Secondary | ICD-10-CM | POA: Diagnosis not present

## 2022-10-23 DIAGNOSIS — S30861A Insect bite (nonvenomous) of abdominal wall, initial encounter: Secondary | ICD-10-CM | POA: Diagnosis not present

## 2022-10-23 DIAGNOSIS — L821 Other seborrheic keratosis: Secondary | ICD-10-CM | POA: Diagnosis not present

## 2022-10-23 DIAGNOSIS — D2372 Other benign neoplasm of skin of left lower limb, including hip: Secondary | ICD-10-CM | POA: Diagnosis not present

## 2022-12-18 ENCOUNTER — Other Ambulatory Visit: Payer: Self-pay

## 2022-12-18 ENCOUNTER — Emergency Department: Payer: BC Managed Care – PPO

## 2022-12-18 ENCOUNTER — Encounter: Payer: Self-pay | Admitting: Emergency Medicine

## 2022-12-18 ENCOUNTER — Emergency Department
Admission: EM | Admit: 2022-12-18 | Discharge: 2022-12-18 | Disposition: A | Discharge status: Home / Self Care | Payer: BC Managed Care – PPO | Attending: Emergency Medicine | Admitting: Emergency Medicine

## 2022-12-18 DIAGNOSIS — Z85828 Personal history of other malignant neoplasm of skin: Secondary | ICD-10-CM | POA: Insufficient documentation

## 2022-12-18 DIAGNOSIS — Z8616 Personal history of COVID-19: Secondary | ICD-10-CM | POA: Insufficient documentation

## 2022-12-18 DIAGNOSIS — S53104A Unspecified dislocation of right ulnohumeral joint, initial encounter: Secondary | ICD-10-CM | POA: Insufficient documentation

## 2022-12-18 DIAGNOSIS — R9431 Abnormal electrocardiogram [ECG] [EKG]: Secondary | ICD-10-CM | POA: Diagnosis not present

## 2022-12-18 DIAGNOSIS — W01198A Fall on same level from slipping, tripping and stumbling with subsequent striking against other object, initial encounter: Secondary | ICD-10-CM | POA: Diagnosis not present

## 2022-12-18 DIAGNOSIS — M25421 Effusion, right elbow: Secondary | ICD-10-CM | POA: Diagnosis not present

## 2022-12-18 DIAGNOSIS — S53124A Posterior dislocation of right ulnohumeral joint, initial encounter: Secondary | ICD-10-CM | POA: Diagnosis not present

## 2022-12-18 DIAGNOSIS — S59901A Unspecified injury of right elbow, initial encounter: Secondary | ICD-10-CM | POA: Diagnosis not present

## 2022-12-18 DIAGNOSIS — Y9222 Religious institution as the place of occurrence of the external cause: Secondary | ICD-10-CM | POA: Insufficient documentation

## 2022-12-18 DIAGNOSIS — S53104D Unspecified dislocation of right ulnohumeral joint, subsequent encounter: Secondary | ICD-10-CM | POA: Diagnosis not present

## 2022-12-18 MED ORDER — LACTATED RINGERS IV BOLUS: 1000.0000 mL | Freq: Once | INTRAVENOUS | Status: DC

## 2022-12-18 MED ORDER — PROPOFOL 10 MG/ML IV BOLUS
45.0000 mg | Freq: Once | INTRAVENOUS | Status: AC
  Administered 2022-12-18: 45 mg via INTRAVENOUS

## 2022-12-18 MED ORDER — PROPOFOL 10 MG/ML IV BOLUS
INTRAVENOUS | Status: AC
  Filled 2022-12-18: qty 20

## 2022-12-18 MED ORDER — PROPOFOL 10 MG/ML IV BOLUS
90.0000 mg | Freq: Once | INTRAVENOUS | Status: AC
  Administered 2022-12-18: 90 mg via INTRAVENOUS
  Filled 2022-12-18: qty 20

## 2022-12-18 MED ORDER — PROPOFOL 10 MG/ML IV BOLUS
INTRAVENOUS | Status: AC | PRN
  Administered 2022-12-18 (×2): 20 mg via INTRAVENOUS

## 2022-12-18 MED ORDER — OXYCODONE-ACETAMINOPHEN 5-325 MG PO TABS: 1.0000 | ORAL_TABLET | ORAL | 0 refills | Status: AC | PRN

## 2022-12-18 MED ORDER — FENTANYL CITRATE PF 50 MCG/ML IJ SOSY
50.0000 ug | PREFILLED_SYRINGE | Freq: Once | INTRAMUSCULAR | Status: AC
  Administered 2022-12-18: 50 ug via INTRAVENOUS
  Filled 2022-12-18: qty 1

## 2022-12-18 NOTE — ED Triage Notes (Signed)
Patient ambulatory to triage with steady gait, without difficulty or distress noted; pt reports [place] and falling at church around 6pm; [facility] where she was dx with rt dislocated elbow; lidocaine was admin per pt but provider was unable to reduce, so sent over for sedation

## 2022-12-18 NOTE — ED Provider Notes (Signed)
[facility] Provider Note    Event Date/Time   First MD Initiated Contact with Patient 12/18/22 2045     (approximate)   History   Arm Injury   HPI  Alice Spencer is a 57 y.o. female with no significant past medical history presents with an elbow dislocation.  Patient was walking when she slipped and fell on the elbow with direct blow.  Did not hit her head.  She was seen at [facility] was found to have a dislocated elbow and they tried intra-articular lidocaine but were unable to reduce it.  Denies any numbness or tingling.  Denies head injury or other pain.  She has not eaten in over 6 hours.     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 08/07/2017    Patient Active Problem List   Diagnosis Date Noted   Grief reaction 05/09/2021   Polyarthritis 05/09/2021   Elevated blood pressure reading without diagnosis of hypertension 05/09/2021   Encounter for Papanicolaou smear for cervical cancer screening 12/05/2019   Overweight (BMI 25.0-29.9) 12/05/2019   History of 2019 novel coronavirus disease (COVID-19) 12/05/2019   Osteoarthritis of hip 07/13/2019   Chronic hip pain, right 06/01/2019   Ganglion cyst of joint of finger of right hand 08/07/2017   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 10/31/2016   Myopia of both eyes 10/29/2016   Right knee pain 07/01/2016   Exertional dyspnea 09/19/2015   Premenstrual syndrome 09/18/2015   Vitamin D deficiency 04/29/2014   Breast microcalcification, mammographic 11/25/2012   Encounter for preventive health examination 09/28/2012     Physical Exam  Triage Vital Signs: ED Triage Vitals  Enc Vitals Group     BP 12/18/22 2041 (!) 155/71     Pulse  Rate 12/18/22 2041 98     Resp 12/18/22 2041 17     Temp 12/18/22 2041 98.7 F (37.1 C)     Temp Source 12/18/22 2041 Oral     SpO2 12/18/22 2041 99 %     Weight 12/18/22 2039 203 lb (92.1 kg)     Height 12/18/22 2039 5\' 8"  (1.727 m)     Head Circumference --      Peak Flow --      Pain Score 12/18/22 2039 8     Pain Loc --      Pain Edu? --      Excl. in GC? --     Most recent vital signs: Vitals:   12/18/22 2230 12/18/22 2245  BP: 139/86 139/66  Pulse: 84 85  Resp: (!) 27 19  Temp:    SpO2: 100% 99%     General: Awake, no distress.  CV:  Good peripheral perfusion.  Resp:  Normal effort.  Abd:  No distention.  Neuro:             Awake, Alert, Oriented x 3  Other:  Obvious deformity to the right elbow with some swelling, 2+ radial pulse, able to wiggle all fingers, no open wounds   ED Results / Procedures / Treatments  Labs (all labs ordered are listed, but only abnormal results are displayed) Labs Reviewed - No data to display  EKG     RADIOLOGY I reviewed and interpreted patient's x-ray of the right elbow which shows a posterior dislocation   PROCEDURES:  Critical Care performed: No  .1-3 Lead EKG Interpretation  Performed by: Georga Hacking, MD Authorized by: Georga Hacking, MD     Interpretation: normal     ECG rate assessment: normal     Rhythm: sinus rhythm     Ectopy: none     Conduction: normal   .Sedation  Date/Time: 12/18/2022 10:56 PM  Performed by: Georga Hacking, MD Authorized by: Georga Hacking, MD   Consent:    Consent obtained:  Verbal and written   Consent given by:  Patient   Risks discussed:  Prolonged hypoxia resulting in organ damage, prolonged sedation necessitating reversal, respiratory compromise necessitating ventilatory assistance and intubation, nausea and inadequate sedation   Alternatives discussed:  Analgesia without sedation Universal protocol:    Procedure explained and questions answered to  patient or proxy's satisfaction: yes     Relevant documents present and verified: yes     Test results available: yes     Imaging studies available: yes     Required blood products, implants, devices, and special equipment available: yes     Site/side marked: yes     Immediately prior to procedure, a time out was called: yes     Patient identity confirmed:  Arm band Indications:    Procedure performed:  Dislocation reduction   Procedure necessitating sedation performed by:  Physician performing sedation Pre-sedation assessment:    Time since last food or drink:  > 6 hrs   ASA classification: class 1 - normal, healthy patient     Mouth opening:  3 or more finger widths   Thyromental distance:  4 finger widths   Mallampati score:  II - soft palate, uvula, fauces visible   Neck mobility: normal     Pre-sedation assessments completed and reviewed: airway patency, cardiovascular function, hydration status, mental status, nausea/vomiting, pain level, respiratory function and temperature   Immediate pre-procedure details:    Reassessment: Patient reassessed immediately prior to procedure     Reviewed: vital signs, relevant labs/tests and NPO status     Verified: bag valve mask available, IV patency confirmed, oxygen available and suction available   Procedure details (see MAR for exact dosages):    Preoxygenation:  Nonrebreather mask   Sedation:  Propofol   Intended level of sedation: deep   Analgesia:  Fentanyl   Intra-procedure monitoring:  Blood pressure monitoring, cardiac monitor, continuous pulse oximetry, continuous capnometry, frequent LOC assessments and frequent vital sign checks   Intra-procedure events: none     Total Provider sedation time (minutes):  12 Post-procedure details:    Attendance: Constant attendance by certified staff until patient recovered     Recovery: Patient returned to pre-procedure baseline     Post-sedation assessments completed and reviewed: airway  patency, cardiovascular function, hydration status, mental status, nausea/vomiting, pain level and respiratory function     Patient is stable for discharge or admission: yes     Procedure completion:  Tolerated well, no immediate complications .Ortho Injury Treatment  Date/Time: 12/18/2022 10:58 PM  Performed by: Georga Hacking, MD Authorized by: Georga Hacking, MD   Consent:    Consent obtained:  Verbal   Consent given by:  Patient   Risks discussed:  Recurrent dislocation, irreducible dislocation, fracture and nerve damage   Alternatives discussed:  No treatmentInjury location: elbow Location details: right elbow  Injury type: dislocation Pre-procedure neurovascular assessment: neurovascularly intact Pre-procedure distal perfusion: normal Pre-procedure neurological function: normal Pre-procedure range of motion: reduced  Anesthesia: Local anesthesia used: no  Patient sedated: Yes. Refer to sedation procedure documentation for details of sedation. Manipulation performed: yes Reduction method: flexion, direct traction and traction and counter traction Reduction successful: yes X-ray confirmed reduction: yes Immobilization: splint Splint type: long arm Splint Applied by: ED Tech Supplies used: Ortho-Glass Post-procedure neurovascular assessment: post-procedure neurovascularly intact Post-procedure distal perfusion: normal Post-procedure neurological function: normal Post-procedure range of motion: improved     The patient is on the cardiac monitor to evaluate for evidence of arrhythmia and/or significant heart rate changes.   MEDICATIONS ORDERED IN ED: Medications  lactated ringers bolus 1,000 mL (1,000 mLs Intravenous Not Given 12/18/22 2222)  fentaNYL (SUBLIMAZE) injection 50 mcg (50 mcg Intravenous Given 12/18/22 2111)  propofol (DIPRIVAN) 10 mg/mL bolus/IV push 90 mg (90 mg Intravenous Given 12/18/22 2205)  propofol (DIPRIVAN) 10 mg/mL bolus/IV push 45 mg (45 mg  Intravenous Given 12/18/22 2208)  propofol (DIPRIVAN) 10 mg/mL bolus/IV push 45 mg (45 mg Intravenous Given 12/18/22 2207)  propofol (DIPRIVAN) 10 mg/mL bolus/IV push ( Intravenous Canceled Entry 12/18/22 2215)     IMPRESSION / MDM / ASSESSMENT AND PLAN / ED COURSE  I reviewed the triage vital signs and the nursing notes.                              Patient's presentation is most consistent with acute complicated illness / injury requiring diagnostic workup.  Differential diagnosis includes, but is not limited to, elbow dislocation, elbow fracture, fracture dislocation  The patient is a 57 year old female who presents after a mechanical fall onto the right elbow.  She was seen at [facility] initially and diagnosed with an elbow dislocation and they attempted reduction after intra-articular lidocaine but were unsuccessful.  Patient's vital signs are stable.  She arrives with a posterior long-arm splint in place has a good pulse and is able to wiggle her fingers.  She is denying hitting her head denies other injuries or pain.  She was consented for procedural sedation with propofol.  Reduction was successful.  Remains neurovascularly intact.  Patient placed again in posterior splint.  Encouraged follow-up with [facility] in about 1 week.       FINAL CLINICAL IMPRESSION(S) / ED DIAGNOSES   Final diagnoses:  Dislocation of right elbow, initial encounter     Rx / DC Orders   ED Discharge Orders          Ordered    oxyCODONE-acetaminophen (PERCOCET) 5-325 MG tablet  Every 4 hours PRN        12/18/22 2255             Note:  This document was prepared using Dragon voice recognition software and may include unintentional dictation errors.   Georga Hacking, MD 12/18/22 2259

## 2022-12-18 NOTE — Discharge Instructions (Addendum)
Take ibuprofen 400 mg every 6-8 hours for pain.  You can take the Percocet as needed for intractable pain.  Please follow-up with [facility].

## 2022-12-19 ENCOUNTER — Telehealth: Payer: Self-pay

## 2022-12-19 DIAGNOSIS — H33011 Retinal detachment with single break, right eye: Secondary | ICD-10-CM | POA: Diagnosis not present

## 2022-12-19 DIAGNOSIS — H43813 Vitreous degeneration, bilateral: Secondary | ICD-10-CM | POA: Diagnosis not present

## 2022-12-19 DIAGNOSIS — H5213 Myopia, bilateral: Secondary | ICD-10-CM | POA: Diagnosis not present

## 2022-12-19 DIAGNOSIS — H35371 Puckering of macula, right eye: Secondary | ICD-10-CM | POA: Diagnosis not present

## 2022-12-19 NOTE — Transitions of Care (Post Inpatient/ED Visit) (Signed)
I spoke with pt;pt [place] [facility] [place] [employer] [facility] [employer] and dislocated rt elbow. Pt has managed the pain during the day with OTC ibuprofen 400 mg and pt picked up oxycodone apap 5-325 mg and will have on hand for more severe pain and to help pt rest at night. Pt already has appt with [facility] [place] on 12/24/22. Sending note to Dr Darrick Huntsman.    12/19/2022  Name: Alice Spencer MRN: [id number] DOB: 08-24-1965  Today's TOC FU Call Status: Today's TOC FU Call Status:: Successful TOC FU Call Competed TOC FU Call Complete Date: 12/19/22  Transition Care Management Follow-up Telephone Call Date of Discharge: 12/18/22 Discharge Facility: [facility]) Type of Discharge: Emergency Department Reason for ED Visit:  ([facility]) How have you been since you were released from the hospital?: Better Any questions or concerns?: No  Items Reviewed: Did you receive and understand the discharge instructions provided?: Yes Medications obtained,verified, and reconciled?: Yes (Medications Reviewed) (pt is taking OTC ibuprofen 400 mg and that is helping pain during the day and pt just got the oxycodone apap5-325 mg for use at nighttime to help pt sleep wtihout pain.) Any new allergies since your discharge?: No Dietary orders reviewed?: NA Do you have support at home?: Yes People in Home: spouse Name of Support/Comfort Primary Source: Danny  Medications Reviewed Today: Medications Reviewed Today     Reviewed by Sandy Salaam, CMA (Certified Medical Assistant) on 05/31/22 at 1446  Med List Status: <None>   Medication Order Taking? Sig Documenting Provider Last Dose Status Informant  meloxicam (MOBIC) 15 MG tablet [id number] Yes TAKE 1 TABLET(15 MG) BY MOUTH DAILY Sherlene Shams, MD Taking Active             Home Care and Equipment/Supplies: Were Home Health Services Ordered?: NA Any new equipment or medical supplies  ordered?: No (pt has splint and sling form rt arm)  Functional Questionnaire: Do you need assistance with bathing/showering or dressing?: No Do you need assistance with meal preparation?: No Do you need assistance with eating?: No Do you have difficulty maintaining continence: No Do you need assistance with getting out of bed/getting out of a chair/moving?: No Do you have difficulty managing or taking your medications?: No  Follow up appointments reviewed: PCP Follow-up appointment confirmed?: NA (pt already has  appt with [facility] for 12/24/22 and if pt needs to see PCP pt will contact [facility] Follow-up appointment confirmed?: Yes Date of Specialist follow-up appointment?: 12/24/22 Follow-Up Specialty Provider:: [facility] [place] Do you need transportation to your follow-up appointment?: No Do you understand care options if your condition(s) worsen?: Yes-patient verbalized understanding    SIGNATURE Lewanda Rife, LPN

## 2022-12-23 ENCOUNTER — Ambulatory Visit: Payer: BC Managed Care – PPO | Admitting: Internal Medicine

## 2022-12-23 ENCOUNTER — Encounter: Payer: Self-pay | Admitting: Internal Medicine

## 2022-12-23 VITALS — BP 126/70 | HR 83 | Temp 98.3°F | Ht 68.0 in | Wt 210.2 lb

## 2022-12-23 DIAGNOSIS — R030 Elevated blood-pressure reading, without diagnosis of hypertension: Secondary | ICD-10-CM

## 2022-12-23 NOTE — Progress Notes (Signed)
Subjective:  Patient ID: Alice Spencer, female    DOB: [birth year]  Age: 57 y.o. MRN: [id number]  CC: There were no encounter diagnoses.   HPI Alice Spencer presents for  Chief Complaint  Patient presents with   Elbow Injury    Dislocated     57 yr old female presents after ER visit  om June 26 for right elbow dislocated.    [facility] tried but could not reduce without anethesia sent her ot [facility] ER and was taken back within 5 mitues.  Diprivan  used for sedation/aneesthsia  Seeing a specialist tomorrow: [facility]. Uisng oxycodone at night only.    Some tingling of arm .     Hx: slipped on water at church while waving to someone.  Landed on elbow .   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 To nostril (Patient not taking: Reported on 12/23/2022) 15 g 0   No facility-administered medications prior to visit.    Review of Systems;  Patient denies headache, fevers, malaise, unintentional weight loss, skin rash, eye pain, sinus congestion and sinus pain, sore throat, dysphagia,  hemoptysis , cough, dyspnea, wheezing, chest pain, palpitations, orthopnea, edema, abdominal pain, nausea, melena, diarrhea, constipation, flank pain, dysuria, hematuria, urinary  Frequency, nocturia, numbness, tingling, seizures,  Focal weakness, Loss of consciousness,  Tremor, insomnia, depression, anxiety, and suicidal ideation.      Objective:  BP (!) 148/70   Pulse 83   Temp 98.3 F (36.8 C) (Oral)   Ht 5\' 8"  (1.727 m)   Wt 210 lb 3.2 oz (95.3 kg)   LMP 04/30/2019   SpO2 96%   BMI 31.96 kg/m   BP Readings from Last 3 Encounters:  12/23/22 (!) 148/70  12/18/22 (!) 117/94  05/31/22 118/74    Wt Readings from Last 3 Encounters:   12/23/22 210 lb 3.2 oz (95.3 kg)  12/18/22 203 lb (92.1 kg)  05/31/22 197 lb 12.8 oz (89.7 kg)    Physical Exam  Lab Results  Component Value Date   HGBA1C 5.7 05/09/2021   HGBA1C 5.7 06/04/2019    Lab Results  Component Value Date   CREATININE 0.85 05/28/2022   CREATININE 0.76 05/09/2021   CREATININE 0.79 06/04/2019    Lab Results  Component Value Date   WBC 5.8 05/09/2021   HGB 12.8 05/09/2021   HCT 37.7 05/09/2021   PLT 291.0 05/09/2021   GLUCOSE 97 05/28/2022   CHOL 254 (H) 05/28/2022   TRIG 119.0 05/28/2022   HDL 75.00 05/28/2022   LDLCALC 155 (H) 05/28/2022   ALT 18 05/28/2022   AST 16 05/28/2022   NA 141 05/28/2022   K 4.4 05/28/2022   CL 104 05/28/2022   CREATININE 0.85 05/28/2022   BUN 19 05/28/2022   CO2 28 05/28/2022   TSH 2.14 05/09/2021   HGBA1C 5.7 05/09/2021   MICROALBUR <0.7 05/28/2022    DG Elbow 2 Views Right  Result Date: 12/18/2022 CLINICAL DATA:  Reduction of dislocation EXAM: RIGHT ELBOW - 2 VIEW COMPARISON:  None Available. FINDINGS: Frontal and lateral views of the right elbow are obtained. Artifact from splint limits evaluation of bony detail. Alignment is anatomic. No acute displaced fracture. Small  elbow effusion. Soft tissues are unremarkable. IMPRESSION: 1. Reduction of prior elbow dislocation with anatomic alignment. 2. Small joint effusion. 3. No acute displaced fracture. Electronically Signed   By: Sharlet Salina M.D.   On: 12/18/2022 22:48   DG Elbow 2 Views Right  Result Date: 12/18/2022 CLINICAL DATA:  Trauma to the right elbow. EXAM: RIGHT ELBOW - 2 VIEW COMPARISON:  None Available. FINDINGS: Dorsal dislocation of the radius and ulna in relation to the humerus. There is probable impaction of the coronoid process on the distal humerus. No definite acute fracture identified on the provided images. Evaluation however is limited due to suboptimal positioning and overlying splint. There is elevation of the posterior fat pad indicative  of joint effusion. The soft tissues are grossly unremarkable IMPRESSION: Dorsal dislocation of the elbow. Electronically Signed   By: Elgie Collard M.D.   On: 12/18/2022 21:23    Assessment & Plan:  .There are no diagnoses linked to this encounter.   I provided 30 minutes of face-to-face time during this encounter reviewing patient's last visit with me, patient's  most recent visit with cardiology,  nephrology,  and neurology,  recent surgical and non surgical procedures, previous  labs and imaging studies, counseling on currently addressed issues,  and post visit ordering to diagnostics and therapeutics .   Follow-up: No follow-ups on file.   Sherlene Shams, MD

## 2022-12-23 NOTE — Patient Instructions (Signed)
Let me know by Wednesday if you need the oxycodone refilled  Continue vitamin d supplementation 2000 ius daily

## 2022-12-24 DIAGNOSIS — S53104D Unspecified dislocation of right ulnohumeral joint, subsequent encounter: Secondary | ICD-10-CM | POA: Diagnosis not present

## 2022-12-26 ENCOUNTER — Encounter: Payer: Self-pay | Admitting: Internal Medicine

## 2022-12-26 NOTE — Assessment & Plan Note (Signed)
She has no history of hypertension but had an  elevated office reading last year. she reports compliance with medication regimen  but has an elevated reading today in office.   She has been asked to check her  BP  at home and  submit readings which have been normal renal function, electrolytes and screen for proteinuria are all normal .  Lab Results  Component Value Date   CREATININE 0.85 05/28/2022   Lab Results  Component Value Date   NA 141 05/28/2022   K 4.4 05/28/2022   CL 104 05/28/2022   CO2 28 05/28/2022   Lab Results  Component Value Date   MICROALBUR <0.7 05/28/2022   MICROALBUR <0.7 05/09/2021

## 2023-01-09 ENCOUNTER — Other Ambulatory Visit: Payer: Self-pay | Admitting: Orthopaedic Surgery

## 2023-01-09 DIAGNOSIS — S53104D Unspecified dislocation of right ulnohumeral joint, subsequent encounter: Secondary | ICD-10-CM

## 2023-01-15 ENCOUNTER — Ambulatory Visit
Admission: RE | Admit: 2023-01-15 | Discharge: 2023-01-15 | Disposition: A | Discharge status: Home / Self Care | Payer: BC Managed Care – PPO | Source: Ambulatory Visit | Attending: Orthopaedic Surgery | Admitting: Orthopaedic Surgery

## 2023-01-15 DIAGNOSIS — S53104A Unspecified dislocation of right ulnohumeral joint, initial encounter: Secondary | ICD-10-CM | POA: Diagnosis not present

## 2023-01-15 DIAGNOSIS — S53104D Unspecified dislocation of right ulnohumeral joint, subsequent encounter: Secondary | ICD-10-CM | POA: Diagnosis not present

## 2023-01-15 DIAGNOSIS — M19031 Primary osteoarthritis, right wrist: Secondary | ICD-10-CM | POA: Diagnosis not present

## 2023-01-15 DIAGNOSIS — M25421 Effusion, right elbow: Secondary | ICD-10-CM | POA: Diagnosis not present

## 2023-01-30 DIAGNOSIS — S53104D Unspecified dislocation of right ulnohumeral joint, subsequent encounter: Secondary | ICD-10-CM | POA: Diagnosis not present

## 2023-02-10 DIAGNOSIS — M25621 Stiffness of right elbow, not elsewhere classified: Secondary | ICD-10-CM | POA: Diagnosis not present

## 2023-02-13 DIAGNOSIS — M25621 Stiffness of right elbow, not elsewhere classified: Secondary | ICD-10-CM | POA: Diagnosis not present

## 2023-02-18 DIAGNOSIS — M25621 Stiffness of right elbow, not elsewhere classified: Secondary | ICD-10-CM | POA: Diagnosis not present

## 2023-02-20 DIAGNOSIS — M25621 Stiffness of right elbow, not elsewhere classified: Secondary | ICD-10-CM | POA: Diagnosis not present

## 2023-02-27 DIAGNOSIS — M25621 Stiffness of right elbow, not elsewhere classified: Secondary | ICD-10-CM | POA: Diagnosis not present

## 2023-03-07 DIAGNOSIS — M25621 Stiffness of right elbow, not elsewhere classified: Secondary | ICD-10-CM | POA: Diagnosis not present

## 2023-03-11 DIAGNOSIS — M25621 Stiffness of right elbow, not elsewhere classified: Secondary | ICD-10-CM | POA: Diagnosis not present

## 2023-03-14 DIAGNOSIS — M25621 Stiffness of right elbow, not elsewhere classified: Secondary | ICD-10-CM | POA: Diagnosis not present

## 2023-03-21 DIAGNOSIS — M25621 Stiffness of right elbow, not elsewhere classified: Secondary | ICD-10-CM | POA: Diagnosis not present

## 2023-04-02 DIAGNOSIS — M25621 Stiffness of right elbow, not elsewhere classified: Secondary | ICD-10-CM | POA: Diagnosis not present

## 2023-07-03 ENCOUNTER — Telehealth: Payer: Self-pay

## 2023-07-03 DIAGNOSIS — E782 Mixed hyperlipidemia: Secondary | ICD-10-CM

## 2023-07-03 DIAGNOSIS — R7301 Impaired fasting glucose: Secondary | ICD-10-CM

## 2023-07-03 DIAGNOSIS — E663 Overweight: Secondary | ICD-10-CM

## 2023-07-03 DIAGNOSIS — E559 Vitamin D deficiency, unspecified: Secondary | ICD-10-CM

## 2023-07-03 DIAGNOSIS — R5383 Other fatigue: Secondary | ICD-10-CM

## 2023-07-03 NOTE — Telephone Encounter (Signed)
 Patient sent a message via MyChart to find out if Dr. Verneita Kettering would like for her to have her labs drawn prior to her appointment for her physical on 08/04/2023.  I let her know that I do not see lab orders in our system at this time, so I will send a message to Dr. Kettering to see if she would like for patient to have labs drawn prior to her visit.

## 2023-07-03 NOTE — Telephone Encounter (Signed)
I have pended lab orders for your approval 

## 2023-07-04 NOTE — Telephone Encounter (Signed)
 LMTCB. Need to scheduled pt for a lab appt a few days before her appt with Dr. Elpidio Eric on 08/04/2023.

## 2023-07-08 NOTE — Telephone Encounter (Signed)
Lab appt has been scheduled.

## 2023-07-25 ENCOUNTER — Encounter: Payer: BC Managed Care – PPO | Admitting: Internal Medicine

## 2023-07-28 ENCOUNTER — Other Ambulatory Visit (INDEPENDENT_AMBULATORY_CARE_PROVIDER_SITE_OTHER): Payer: BC Managed Care – PPO

## 2023-07-28 DIAGNOSIS — E782 Mixed hyperlipidemia: Secondary | ICD-10-CM | POA: Diagnosis not present

## 2023-07-28 DIAGNOSIS — R7301 Impaired fasting glucose: Secondary | ICD-10-CM | POA: Diagnosis not present

## 2023-07-28 DIAGNOSIS — R5383 Other fatigue: Secondary | ICD-10-CM

## 2023-07-28 LAB — LIPID PANEL
Cholesterol: 221 mg/dL — ABNORMAL HIGH (ref 0–200)
HDL: 64.8 mg/dL (ref >39.00)
LDL Cholesterol: 132 mg/dL — ABNORMAL HIGH (ref 0–99)
NonHDL: 156.65
Total CHOL/HDL Ratio: 3
Triglycerides: 124 mg/dL (ref 0.0–149.0)
VLDL: 24.8 mg/dL (ref 0.0–40.0)

## 2023-07-28 LAB — TSH: TSH: 3.17 u[IU]/mL (ref 0.35–5.50)

## 2023-07-28 LAB — COMPREHENSIVE METABOLIC PANEL
ALT: 20 U/L (ref 0–35)
AST: 19 U/L (ref 0–37)
Albumin: 4.4 g/dL (ref 3.5–5.2)
Alkaline Phosphatase: 84 U/L (ref 39–117)
BUN: 15 mg/dL (ref 6–23)
CO2: 24 meq/L (ref 19–32)
Calcium: 9.3 mg/dL (ref 8.4–10.5)
Chloride: 105 meq/L (ref 96–112)
Creatinine, Ser: 0.88 mg/dL (ref 0.40–1.20)
GFR: 72.75 mL/min (ref >60.00)
Glucose, Bld: 106 mg/dL — ABNORMAL HIGH (ref 70–99)
Potassium: 4 meq/L (ref 3.5–5.1)
Sodium: 140 meq/L (ref 135–145)
Total Bilirubin: 0.7 mg/dL (ref 0.2–1.2)
Total Protein: 7.5 g/dL (ref 6.0–8.3)

## 2023-07-28 LAB — HEMOGLOBIN A1C: Hgb A1c MFr Bld: 5.9 % (ref 4.6–6.5)

## 2023-07-28 LAB — LDL CHOLESTEROL, DIRECT: Direct LDL: 148 mg/dL

## 2023-07-29 ENCOUNTER — Encounter: Payer: Self-pay | Admitting: Internal Medicine

## 2023-07-30 DIAGNOSIS — L538 Other specified erythematous conditions: Secondary | ICD-10-CM | POA: Diagnosis not present

## 2023-07-30 DIAGNOSIS — D485 Neoplasm of uncertain behavior of skin: Secondary | ICD-10-CM | POA: Diagnosis not present

## 2023-07-30 DIAGNOSIS — D225 Melanocytic nevi of trunk: Secondary | ICD-10-CM | POA: Diagnosis not present

## 2023-07-30 DIAGNOSIS — Z85828 Personal history of other malignant neoplasm of skin: Secondary | ICD-10-CM | POA: Diagnosis not present

## 2023-07-30 DIAGNOSIS — D2262 Melanocytic nevi of left upper limb, including shoulder: Secondary | ICD-10-CM | POA: Diagnosis not present

## 2023-07-30 DIAGNOSIS — D2261 Melanocytic nevi of right upper limb, including shoulder: Secondary | ICD-10-CM | POA: Diagnosis not present

## 2023-07-30 DIAGNOSIS — L820 Inflamed seborrheic keratosis: Secondary | ICD-10-CM | POA: Diagnosis not present

## 2023-07-30 DIAGNOSIS — D0359 Melanoma in situ of other part of trunk: Secondary | ICD-10-CM | POA: Diagnosis not present

## 2023-08-04 ENCOUNTER — Encounter: Payer: BC Managed Care – PPO | Admitting: Internal Medicine

## 2023-08-13 ENCOUNTER — Encounter: Payer: BC Managed Care – PPO | Admitting: Internal Medicine

## 2023-08-20 ENCOUNTER — Other Ambulatory Visit (HOSPITAL_COMMUNITY)
Admission: RE | Admit: 2023-08-20 | Discharge: 2023-08-20 | Disposition: A | Discharge status: Home / Self Care | Source: Ambulatory Visit | Attending: Internal Medicine | Admitting: Internal Medicine

## 2023-08-20 ENCOUNTER — Ambulatory Visit (INDEPENDENT_AMBULATORY_CARE_PROVIDER_SITE_OTHER): Payer: BC Managed Care – PPO | Admitting: Internal Medicine

## 2023-08-20 ENCOUNTER — Encounter: Payer: Self-pay | Admitting: Internal Medicine

## 2023-08-20 VITALS — BP 136/76 | HR 87 | Ht 68.0 in | Wt 204.2 lb

## 2023-08-20 DIAGNOSIS — R030 Elevated blood-pressure reading, without diagnosis of hypertension: Secondary | ICD-10-CM

## 2023-08-20 DIAGNOSIS — Z0000 Encounter for general adult medical examination without abnormal findings: Secondary | ICD-10-CM | POA: Diagnosis not present

## 2023-08-20 DIAGNOSIS — Z124 Encounter for screening for malignant neoplasm of cervix: Secondary | ICD-10-CM | POA: Insufficient documentation

## 2023-08-20 DIAGNOSIS — Z1231 Encounter for screening mammogram for malignant neoplasm of breast: Secondary | ICD-10-CM

## 2023-08-20 DIAGNOSIS — H33011 Retinal detachment with single break, right eye: Secondary | ICD-10-CM | POA: Diagnosis not present

## 2023-08-20 DIAGNOSIS — E663 Overweight: Secondary | ICD-10-CM | POA: Diagnosis not present

## 2023-08-20 DIAGNOSIS — J301 Allergic rhinitis due to pollen: Secondary | ICD-10-CM

## 2023-08-20 NOTE — Progress Notes (Unsigned)
 Patient ID: Alice Spencer, female    DOB: 1965-08-18  Age: 58 y.o. MRN: [id number]  The patient is here for annual preventive examination and management of other chronic and acute problems.   The risk factors are reflected in the social history.   The roster of all physicians providing medical care to patient - is listed in the Snapshot section of the chart.   Activities of daily living:  The patient is 100% independent in all ADLs: dressing, toileting, feeding as well as independent mobility   Home safety : The patient has smoke detectors in the home. They wear seatbelts.  There are no unsecured firearms at home. There is no violence in the home.    There is no risks for hepatitis, STDs or HIV. There is no   history of blood transfusion. They have no travel history to infectious disease endemic areas of the world.   The patient has seen their dentist in the last six month. They have seen their eye doctor in the last year. The patinet  denies slight hearing difficulty with regard to whispered voices and some television programs.  They have deferred audiologic testing in the last year.  They do not  have excessive sun exposure. Discussed the need for sun protection: hats, long sleeves and use of sunscreen if there is significant sun exposure.    Diet: the importance of a healthy diet is discussed. They do have a healthy diet.   The benefits of regular aerobic exercise were discussed. The patient  exercises  3 to 5 days per week  for  60 minutes.    Depression screen: there are no signs or vegative symptoms of depression- irritability, change in appetite, anhedonia, sadness/tearfullness.   The following portions of the patient's history were reviewed and updated as appropriate: allergies, current medications, past family history, past medical history,  past surgical history, past social history  and problem list.   Visual acuity was not assessed per patient preference since the patient has regular  follow up with an  ophthalmologist. Hearing and body mass index were assessed and reviewed.    During the course of the visit the patient was educated and counseled about appropriate screening and preventive services including : fall prevention , diabetes screening, nutrition counseling, colorectal cancer screening, and recommended immunizations.    Chief Complaint:    H/o fall at church in July  resulting in right elbow dislocation requiring general anesthesia to reduce it after multiple attempts were unsuccessful   Sees retinal doc once a year for h/o right eye retial detachment in 2023   Review of Symptoms  Patient denies headache, fevers, malaise, unintentional weight loss, skin rash, eye pain, sinus congestion and sinus pain, sore throat, dysphagia,  hemoptysis , cough, dyspnea, wheezing, chest pain, palpitations, orthopnea, edema, abdominal pain, nausea, melena, diarrhea, constipation, flank pain, dysuria, hematuria, urinary  Frequency, nocturia, numbness, tingling, seizures,  Focal weakness, Loss of consciousness,  Tremor, insomnia, depression, anxiety, and suicidal ideation.    Physical Exam:  BP 136/76   Pulse 87   Ht 5\' 8"  (1.727 m)   Wt 204 lb 3.2 oz (92.6 kg)   LMP 04/30/2019   SpO2 97%   BMI 31.05 kg/m    Physical Exam  Assessment and Plan: Encounter for preventive health examination  Encounter for screening mammogram for malignant neoplasm of breast  Cervical cancer screening    No follow-ups on file.  Sherlene Shams, MD

## 2023-08-20 NOTE — Assessment & Plan Note (Signed)
 She continues to have annual follow up with retina specialist who is monitoring the 'wrinkle on her retina"

## 2023-08-20 NOTE — Patient Instructions (Addendum)
 Your cholesterol is well below the threshhold for treatment based on your ten year risk of heart attack or stroke being  Calculated as less than 5% (this was determined  by application of the American Heart Association  calculator to your current lipid panel)   For your cat allergies,  you have lots of OTC options!  Zyrtec, claritin or allegra ([employer] carries the generics for cheap) Astelin nasal spray (antihistamine) Flonase steroid nasal spray

## 2023-08-21 DIAGNOSIS — J309 Allergic rhinitis, unspecified: Secondary | ICD-10-CM | POA: Insufficient documentation

## 2023-08-21 NOTE — Assessment & Plan Note (Signed)

## 2023-08-21 NOTE — Assessment & Plan Note (Signed)
 Reviewed options of treatment

## 2023-08-21 NOTE — Assessment & Plan Note (Signed)
 She has no history of hypertension but had an  elevated office reading last year. Home readings have been normal ;  renal function, electrolytes and screen for proteinuria are all normal .  Lab Results  Component Value Date   CREATININE 0.88 07/28/2023   Lab Results  Component Value Date   NA 140 07/28/2023   K 4.0 07/28/2023   CL 105 07/28/2023   CO2 24 07/28/2023   Lab Results  Component Value Date   MICROALBUR <0.7 05/28/2022   MICROALBUR <0.7 05/09/2021

## 2023-08-21 NOTE — Assessment & Plan Note (Signed)
I have congratulated her in reduction of   BMI and encouraged  Continued  Efforts to reach goal of 160 lbs using a low glycemic index diet and regular exercise a minimum of 5 days per week.   

## 2023-08-25 ENCOUNTER — Encounter: Payer: Self-pay | Admitting: Internal Medicine

## 2023-08-25 LAB — CYTOLOGY - PAP
Comment: NEGATIVE
Diagnosis: NEGATIVE
High risk HPV: NEGATIVE

## 2023-08-26 ENCOUNTER — Encounter: Payer: Self-pay | Admitting: Internal Medicine

## 2023-08-28 DIAGNOSIS — D0359 Melanoma in situ of other part of trunk: Secondary | ICD-10-CM | POA: Diagnosis not present

## 2023-09-03 ENCOUNTER — Ambulatory Visit
Admission: RE | Admit: 2023-09-03 | Discharge: 2023-09-03 | Disposition: A | Discharge status: Home / Self Care | Source: Ambulatory Visit | Attending: Internal Medicine | Admitting: Internal Medicine

## 2023-09-03 DIAGNOSIS — Z1231 Encounter for screening mammogram for malignant neoplasm of breast: Secondary | ICD-10-CM | POA: Insufficient documentation

## 2023-09-11 DIAGNOSIS — D485 Neoplasm of uncertain behavior of skin: Secondary | ICD-10-CM | POA: Diagnosis not present

## 2023-09-11 DIAGNOSIS — L820 Inflamed seborrheic keratosis: Secondary | ICD-10-CM | POA: Diagnosis not present

## 2023-09-26 DIAGNOSIS — H5213 Myopia, bilateral: Secondary | ICD-10-CM | POA: Diagnosis not present

## 2023-11-07 ENCOUNTER — Ambulatory Visit
Admission: EM | Admit: 2023-11-07 | Discharge: 2023-11-07 | Disposition: A | Discharge status: Home / Self Care | Attending: Emergency Medicine | Admitting: Emergency Medicine

## 2023-11-07 DIAGNOSIS — J0100 Acute maxillary sinusitis, unspecified: Secondary | ICD-10-CM | POA: Diagnosis not present

## 2023-11-07 MED ORDER — CEFDINIR 300 MG PO CAPS: 300.0000 mg | ORAL_CAPSULE | Freq: Two times a day (BID) | ORAL | 0 refills | Status: AC

## 2023-11-07 NOTE — ED Triage Notes (Signed)
 Patient to Urgent Care with complaints of nasal congestion/ cough w/ green mucus production. Denies any fevers.  Symptoms started 5 days ago. Reports her granddaughter has been sick w/ same symptoms/ diagnosed with pneumonia.  Meds: no otc

## 2023-11-07 NOTE — Discharge Instructions (Addendum)
 Take the cefdinir as directed.  Follow-up with your primary care provider if your symptoms are not improving.

## 2023-11-07 NOTE — ED Provider Notes (Signed)
 [facility]    CSN: [id number] Arrival date & time: 11/07/23  1258      History   Chief Complaint Chief Complaint  Patient presents with   Nasal Congestion   Cough    HPI Alice Spencer is a 58 y.o. female.  Patient presents with 1 week history of congestion and cough.  No OTC medication taken today.  No fever or shortness of breath.  Patient is concerned because her granddaughter has pneumonia and was with her last weekend.  The history is provided by the patient and medical records.    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 08/07/2017    Patient Active Problem List   Diagnosis Date Noted   Allergic rhinitis 08/21/2023   Elevated blood pressure reading without diagnosis of hypertension 05/09/2021   Encounter for Papanicolaou smear for cervical cancer screening 12/05/2019   Overweight (BMI 25.0-29.9) 12/05/2019   History of 2019 novel coronavirus disease (COVID-19) 12/05/2019   Osteoarthritis of hip 07/13/2019   Chronic hip pain, right 06/01/2019   Ganglion cyst of joint of finger of right hand 08/07/2017   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 10/31/2016   Myopia of both eyes 10/29/2016   Premenstrual syndrome 09/18/2015   Vitamin D  deficiency 04/29/2014   Breast microcalcification, mammographic 11/25/2012   Encounter for preventive health examination 09/28/2012    Past Surgical History:  Procedure Laterality Date   BREAST BIOPSY Left 2014   stereo for calcification, Fibroadenoma   BREAST EXCISIONAL BIOPSY Left 09/28/2007   neg   BREAST SURGERY Left 2009   benign phylloid tumor   COLONOSCOPY WITH PROPOFOL  N/A 11/08/2015   Procedure: COLONOSCOPY WITH PROPOFOL ;  Surgeon: Marshall Skeeter,  MD;  Location: [facility];  Service: Endoscopy;  Laterality: N/A;   TONSILLECTOMY  1973    OB History     Gravida  1   Para  1   Term      Preterm      AB      Living  1      SAB      IAB      Ectopic      Multiple      Live Births           Obstetric Comments  First Period 15 First pregnancy 74          Home Medications    Prior to Admission medications   Medication Sig Start Date End Date Taking? Authorizing Provider  cefdinir (OMNICEF) 300 MG capsule Take 1 capsule (300 mg total) by mouth 2 (two) times daily for 10 days. 11/07/23 11/17/23 Yes Wellington Half, NP  meloxicam  (MOBIC ) 15 MG tablet TAKE 1 TABLET(15 MG) BY MOUTH DAILY Patient taking differently: Take 15 mg by mouth as needed. TAKE 1 TABLET(15 MG) BY MOUTH DAILY 11/05/21   Thersia Flax, MD    Family History Family History  Problem Relation Age of Onset   Cancer Mother 27       pancreatic   Arthritis Father    Diabetes Father    Arthritis Maternal Grandfather    Cancer Maternal Grandfather        colon  Cancer Paternal Grandfather        carcinoma of the skin   Cancer Maternal Uncle        colon   Cancer Maternal Uncle        colon   Cancer Paternal Aunt        lung   Breast cancer Neg Hx     Social History Social History   Tobacco Use   Smoking status: Never   Smokeless tobacco: Never  Substance Use Topics   Alcohol use: Yes    Alcohol/week: 2.0 standard drinks of alcohol    Types: 1 Glasses of wine, 1 Shots of liquor per week    Comment: occasionally   Drug use: No     Allergies   Penicillins   Review of Systems Review of Systems  Constitutional:  Negative for chills and fever.  HENT:  Positive for congestion, postnasal drip and sinus pressure. Negative for ear pain and sore throat.   Respiratory:  Positive for cough. Negative for shortness of breath.      Physical Exam Triage Vital Signs ED Triage Vitals  Encounter Vitals Group     BP 11/07/23 1328  133/77     Systolic BP Percentile --      Diastolic BP Percentile --      Pulse Rate 11/07/23 1328 88     Resp 11/07/23 1328 18     Temp 11/07/23 1328 98.4 F (36.9 C)     Temp src --      SpO2 11/07/23 1328 97 %     Weight --      Height --      Head Circumference --      Peak Flow --      Pain Score 11/07/23 1331 4     Pain Loc --      Pain Education --      Exclude from Growth Chart --    No data found.  Updated Vital Signs BP 133/77   Pulse 88   Temp 98.4 F (36.9 C)   Resp 18   LMP 04/30/2019   SpO2 97%   Visual Acuity Right Eye Distance:   Left Eye Distance:   Bilateral Distance:    Right Eye Near:   Left Eye Near:    Bilateral Near:     Physical Exam Constitutional:      General: She is not in acute distress. HENT:     Right Ear: Tympanic membrane normal.     Left Ear: Tympanic membrane normal.     Nose: Congestion present.     Mouth/Throat:     Mouth: Mucous membranes are moist.     Pharynx: Oropharynx is clear.  Cardiovascular:     Rate and Rhythm: Normal rate and regular rhythm.     Heart sounds: Normal heart sounds.  Pulmonary:     Effort: Pulmonary effort is normal. No respiratory distress.     Breath sounds: Normal breath sounds.  Neurological:     Mental Status: She is alert.      UC Treatments / Results  Labs (all labs ordered are listed, but only abnormal results are displayed) Labs Reviewed - No data to display  EKG   Radiology No results found.  Procedures Procedures (including critical care time)  Medications Ordered in UC Medications - No data to display  Initial Impression / Assessment and Plan / UC Course  I have reviewed the triage vital signs and the nursing notes.  Pertinent  labs & imaging results that were available during my care of the patient were reviewed by me and considered in my medical decision making (see chart for details).    Acute sinusitis.  Afebrile and vital signs are stable.  Lungs are clear  and O2 sat is 97% on room air.  Treating today with cefdinir.  Patient is allergic to penicillin but states she has taken cephalosporins in the past without difficulty.  Tylenol  as needed.  Plain Mucinex  as needed.  Instructed patient to follow up with her PCP if her symptoms are not improving.  She agrees to plan of care.   Final Clinical Impressions(s) / UC Diagnoses   Final diagnoses:  Acute non-recurrent maxillary sinusitis     Discharge Instructions      Take the cefdinir as directed.  Follow-up with your primary care provider if your symptoms are not improving.    ED Prescriptions     Medication Sig Dispense Auth. Provider   cefdinir (OMNICEF) 300 MG capsule Take 1 capsule (300 mg total) by mouth 2 (two) times daily for 10 days. 20 capsule Wellington Half, NP      PDMP not reviewed this encounter.   Wellington Half, NP 11/07/23 1348

## 2023-12-17 DIAGNOSIS — D2262 Melanocytic nevi of left upper limb, including shoulder: Secondary | ICD-10-CM | POA: Diagnosis not present

## 2023-12-17 DIAGNOSIS — D225 Melanocytic nevi of trunk: Secondary | ICD-10-CM | POA: Diagnosis not present

## 2023-12-17 DIAGNOSIS — D2261 Melanocytic nevi of right upper limb, including shoulder: Secondary | ICD-10-CM | POA: Diagnosis not present

## 2023-12-17 DIAGNOSIS — D2272 Melanocytic nevi of left lower limb, including hip: Secondary | ICD-10-CM | POA: Diagnosis not present

## 2023-12-18 DIAGNOSIS — H33011 Retinal detachment with single break, right eye: Secondary | ICD-10-CM | POA: Diagnosis not present

## 2023-12-18 DIAGNOSIS — H35341 Macular cyst, hole, or pseudohole, right eye: Secondary | ICD-10-CM | POA: Diagnosis not present

## 2023-12-18 DIAGNOSIS — H43813 Vitreous degeneration, bilateral: Secondary | ICD-10-CM | POA: Diagnosis not present

## 2023-12-18 DIAGNOSIS — H35371 Puckering of macula, right eye: Secondary | ICD-10-CM | POA: Diagnosis not present

## 2024-03-31 ENCOUNTER — Ambulatory Visit: Admitting: Internal Medicine

## 2024-03-31 ENCOUNTER — Encounter: Payer: Self-pay | Admitting: Internal Medicine

## 2024-03-31 VITALS — BP 134/78 | HR 84 | Ht 68.0 in | Wt 198.0 lb

## 2024-03-31 DIAGNOSIS — K2100 Gastro-esophageal reflux disease with esophagitis, without bleeding: Secondary | ICD-10-CM | POA: Diagnosis not present

## 2024-03-31 MED ORDER — ESOMEPRAZOLE MAGNESIUM 20 MG PO CPDR: 20.0000 mg | DELAYED_RELEASE_CAPSULE | Freq: Every day | ORAL | 0 refills | Status: AC

## 2024-03-31 NOTE — Patient Instructions (Addendum)
 Take nexium 20 mg  twice daily    for now to get the GERD under control .  (TAKE ON AN EMPTY STOMACH)    I am ordering a DG esophagus (x ray of esophagus using barium ) to evaluate your swallowing issues.   This is an x ray of your esophagus during different phases of swallowing.  You will be asked to drink a chalky substance and pictures will be taken during your swallowing to see if there is a stricture.  Please call [phone] to set this up at [facility]

## 2024-03-31 NOTE — Progress Notes (Signed)
 Subjective:  Patient ID: Alice Spencer, female    DOB: 1965-09-19  Age: 58 y.o. MRN: [id number]  CC: The encounter diagnosis was Gastroesophageal reflux disease with esophagitis without hemorrhage.   HPI Alice Spencer presents for  Chief Complaint  Patient presents with   Gastroesophageal Reflux   Alice Spencer IS A 58 YR OLD FEMALE presents with symptoms of reglux that started about 6 months AGO as sporadic now occurring daily  For the last several weeks.  Describes a burning sensation in her throat that occurs after eating,  often at night.  For the last month she has had persistent symptoms and several occasions the sensation of  food transiently stopping in her lower esophagus, and recently regurgitated yogurt .  Took Nexium for 2 weeks but stopped  after 2 weeks,   due to concern about chronic acid suppression.  No jaw pain, nausea, shortness of breath,  abdominal pain  no unintentional weight loss or cough    Outpatient Medications Prior to Visit  Medication Sig Dispense Refill   meloxicam  (MOBIC ) 15 MG tablet TAKE 1 TABLET(15 MG) BY MOUTH DAILY (Patient not taking: Reported on 03/31/2024) 90 tablet 1   No facility-administered medications prior to visit.    Review of Systems;  Patient denies headache, fevers, malaise, unintentional weight loss, skin rash, eye pain, sinus congestion and sinus pain, sore throat,   hemoptysis , cough, dyspnea, wheezing,  palpitations, orthopnea, edema, abdominal pain, nausea, melena, diarrhea, constipation, flank pain, dysuria, hematuria, urinary  Frequency, nocturia, numbness, tingling, seizures,  Focal weakness, Loss of consciousness,  Tremor, insomnia, depression, anxiety, and suicidal ideation.      Objective:  BP 134/78   Pulse 84   Ht 5' 8 (1.727 m)   Wt 198 lb (89.8 kg)   LMP 04/30/2019   SpO2 97%   BMI 30.11 kg/m   BP Readings from Last 3 Encounters:  03/31/24 134/78  11/07/23 133/77  08/20/23 136/76    Wt Readings from Last 3 Encounters:   03/31/24 198 lb (89.8 kg)  08/20/23 204 lb 3.2 oz (92.6 kg)  12/23/22 210 lb 3.2 oz (95.3 kg)    Physical Exam Vitals reviewed.  Constitutional:      General: She is not in acute distress.    Appearance: Normal appearance. She is normal weight. She is not ill-appearing, toxic-appearing or diaphoretic.  HENT:     Head: Normocephalic.  Eyes:     General: No scleral icterus.       Right eye: No discharge.        Left eye: No discharge.     Conjunctiva/sclera: Conjunctivae normal.  Cardiovascular:     Rate and Rhythm: Normal rate and regular rhythm.     Heart sounds: Normal heart sounds.  Pulmonary:     Effort: Pulmonary effort is normal. No respiratory distress.     Breath sounds: Normal breath sounds.  Musculoskeletal:        General: Normal range of motion.  Skin:    General: Skin is warm and dry.  Neurological:     General: No focal deficit present.     Mental Status: She is alert and oriented to person, place, and time. Mental status is at baseline.  Psychiatric:        Mood and Affect: Mood normal.        Behavior: Behavior normal.        Thought Content: Thought content normal.        Judgment: Judgment  normal.     Lab Results  Component Value Date   HGBA1C 5.9 07/28/2023   HGBA1C 5.7 05/09/2021   HGBA1C 5.7 06/04/2019    Lab Results  Component Value Date   CREATININE 0.88 07/28/2023   CREATININE 0.85 05/28/2022   CREATININE 0.76 05/09/2021    Lab Results  Component Value Date   WBC 5.8 05/09/2021   HGB 12.8 05/09/2021   HCT 37.7 05/09/2021   PLT 291.0 05/09/2021   GLUCOSE 106 (H) 07/28/2023   CHOL 221 (H) 07/28/2023   TRIG 124.0 07/28/2023   HDL 64.80 07/28/2023   LDLDIRECT 148.0 07/28/2023   LDLCALC 132 (H) 07/28/2023   ALT 20 07/28/2023   AST 19 07/28/2023   NA 140 07/28/2023   K 4.0 07/28/2023   CL 105 07/28/2023   CREATININE 0.88 07/28/2023   BUN 15 07/28/2023   CO2 24 07/28/2023   TSH 3.17 07/28/2023   HGBA1C 5.9 07/28/2023    No  results found.  Assessment & Plan:  .Gastroesophageal reflux disease with esophagitis without hemorrhage Assessment & Plan: Symtoms have been recurrent for the past 4 -6 months and now having dysphagia.  RESUME nexium 20 mg bid.  DG esophagus ordered to rule out stricture.  If negative, and symptoms persist, will need EGD   Orders: -     DG ESOPHAGUS W SINGLE CM (SOL OR THIN BA); Future  Other orders -     Esomeprazole Magnesium; Take 1 capsule (20 mg total) by mouth daily at 12 noon.  Dispense: 60 capsule; Refill: 0   Follow-up: No follow-ups on file.   Verneita LITTIE Kettering, MD

## 2024-03-31 NOTE — Assessment & Plan Note (Addendum)
 Symtoms have been recurrent for the past 4 -6 months and now having dysphagia.  RESUME nexium 20 mg bid.  DG esophagus ordered to rule out stricture.  If negative, and symptoms persist, will need EGD

## 2024-04-12 ENCOUNTER — Ambulatory Visit: Admitting: Internal Medicine

## 2024-04-14 ENCOUNTER — Ambulatory Visit
Admission: RE | Admit: 2024-04-14 | Discharge: 2024-04-14 | Disposition: A | Discharge status: Home / Self Care | Source: Ambulatory Visit | Attending: Internal Medicine | Admitting: Internal Medicine

## 2024-04-14 DIAGNOSIS — K449 Diaphragmatic hernia without obstruction or gangrene: Secondary | ICD-10-CM | POA: Diagnosis not present

## 2024-04-14 DIAGNOSIS — K2100 Gastro-esophageal reflux disease with esophagitis, without bleeding: Secondary | ICD-10-CM | POA: Insufficient documentation

## 2024-04-14 DIAGNOSIS — K224 Dyskinesia of esophagus: Secondary | ICD-10-CM | POA: Diagnosis not present

## 2024-04-14 DIAGNOSIS — K219 Gastro-esophageal reflux disease without esophagitis: Secondary | ICD-10-CM | POA: Diagnosis not present

## 2024-04-15 ENCOUNTER — Ambulatory Visit: Payer: Self-pay | Admitting: Internal Medicine

## 2024-04-15 DIAGNOSIS — K2100 Gastro-esophageal reflux disease with esophagitis, without bleeding: Secondary | ICD-10-CM

## 2024-04-15 DIAGNOSIS — R1319 Other dysphagia: Secondary | ICD-10-CM

## 2024-04-22 DIAGNOSIS — Z131 Encounter for screening for diabetes mellitus: Secondary | ICD-10-CM | POA: Diagnosis not present

## 2024-04-22 DIAGNOSIS — E782 Mixed hyperlipidemia: Secondary | ICD-10-CM | POA: Diagnosis not present

## 2024-04-22 DIAGNOSIS — N951 Menopausal and female climacteric states: Secondary | ICD-10-CM | POA: Diagnosis not present

## 2024-04-22 DIAGNOSIS — R635 Abnormal weight gain: Secondary | ICD-10-CM | POA: Diagnosis not present

## 2024-04-22 DIAGNOSIS — Z1329 Encounter for screening for other suspected endocrine disorder: Secondary | ICD-10-CM | POA: Diagnosis not present

## 2024-04-22 DIAGNOSIS — Z130 Encounter for screening for diseases of the blood and blood-forming organs and certain disorders involving the immune mechanism: Secondary | ICD-10-CM | POA: Diagnosis not present

## 2024-04-27 ENCOUNTER — Ambulatory Visit: Admitting: Internal Medicine

## 2024-04-30 DIAGNOSIS — Z1331 Encounter for screening for depression: Secondary | ICD-10-CM | POA: Diagnosis not present

## 2024-04-30 DIAGNOSIS — R790 Abnormal level of blood mineral: Secondary | ICD-10-CM | POA: Diagnosis not present

## 2024-04-30 DIAGNOSIS — N951 Menopausal and female climacteric states: Secondary | ICD-10-CM | POA: Diagnosis not present

## 2024-04-30 DIAGNOSIS — E559 Vitamin D deficiency, unspecified: Secondary | ICD-10-CM | POA: Diagnosis not present

## 2024-04-30 DIAGNOSIS — E782 Mixed hyperlipidemia: Secondary | ICD-10-CM | POA: Diagnosis not present

## 2024-04-30 DIAGNOSIS — E669 Obesity, unspecified: Secondary | ICD-10-CM | POA: Diagnosis not present

## 2024-05-14 DIAGNOSIS — Z6832 Body mass index (BMI) 32.0-32.9, adult: Secondary | ICD-10-CM | POA: Diagnosis not present

## 2024-05-14 DIAGNOSIS — E782 Mixed hyperlipidemia: Secondary | ICD-10-CM | POA: Diagnosis not present

## 2024-05-28 DIAGNOSIS — Z6832 Body mass index (BMI) 32.0-32.9, adult: Secondary | ICD-10-CM | POA: Diagnosis not present

## 2024-05-28 DIAGNOSIS — E782 Mixed hyperlipidemia: Secondary | ICD-10-CM | POA: Diagnosis not present

## 2024-06-02 DIAGNOSIS — D2261 Melanocytic nevi of right upper limb, including shoulder: Secondary | ICD-10-CM | POA: Diagnosis not present

## 2024-06-02 DIAGNOSIS — D2272 Melanocytic nevi of left lower limb, including hip: Secondary | ICD-10-CM | POA: Diagnosis not present

## 2024-06-02 DIAGNOSIS — D2262 Melanocytic nevi of left upper limb, including shoulder: Secondary | ICD-10-CM | POA: Diagnosis not present

## 2024-06-02 DIAGNOSIS — D225 Melanocytic nevi of trunk: Secondary | ICD-10-CM | POA: Diagnosis not present

## 2024-06-04 DIAGNOSIS — Z6832 Body mass index (BMI) 32.0-32.9, adult: Secondary | ICD-10-CM | POA: Diagnosis not present

## 2024-06-04 DIAGNOSIS — E611 Iron deficiency: Secondary | ICD-10-CM | POA: Diagnosis not present

## 2024-06-30 ENCOUNTER — Encounter: Payer: Self-pay | Admitting: Gastroenterology

## 2024-06-30 ENCOUNTER — Ambulatory Visit: Admitting: Gastroenterology

## 2024-06-30 VITALS — BP 142/80 | HR 87 | Ht 68.0 in | Wt 207.0 lb

## 2024-06-30 DIAGNOSIS — R1310 Dysphagia, unspecified: Secondary | ICD-10-CM

## 2024-06-30 DIAGNOSIS — K219 Gastro-esophageal reflux disease without esophagitis: Secondary | ICD-10-CM

## 2024-06-30 DIAGNOSIS — K224 Dyskinesia of esophagus: Secondary | ICD-10-CM | POA: Diagnosis not present

## 2024-06-30 MED ORDER — FAMOTIDINE 20 MG PO TABS: 20.0000 mg | ORAL_TABLET | Freq: Every day | ORAL | 3 refills | Status: AC

## 2024-06-30 NOTE — Patient Instructions (Signed)
 We have sent the following medications to your pharmacy for you to pick up at your convenience: Famotidine  20 mg nightly before bedtime.   _______________________________________________________  If your blood pressure at your visit was 140/90 or greater, please contact your primary care physician to follow up on this.  _______________________________________________________  If you are age 59 or older, your body mass index should be between 23-30. Your Body mass index is 31.47 kg/m. If this is out of the aforementioned range listed, please consider follow up with your Primary Care Provider.  If you are age 63 or younger, your body mass index should be between 19-25. Your Body mass index is 31.47 kg/m. If this is out of the aformentioned range listed, please consider follow up with your Primary Care Provider.   ________________________________________________________  The [facility] providers would like to encourage you to use MYCHART to communicate with providers for non-urgent requests or questions.  Due to long hold times on the telephone, sending your provider a message by [facility] may be a faster and more efficient way to get a response.  Please allow 48 business hours for a response.  Please remember that this is for non-urgent requests.  _______________________________________________________  [facility] is using a team-based approach to care.  Your team is made up of your doctor and two to three APPS. Our APPS (Nurse Practitioners and Physician Assistants) work with your physician to ensure care continuity for you. They are fully qualified to address your health concerns and develop a treatment plan. They communicate directly with your gastroenterologist to care for you. Seeing the Advanced Practice Practitioners on your physician's team can help you by facilitating care more promptly, often allowing for earlier appointments, access to diagnostic testing, procedures, and  other specialty referrals.

## 2024-06-30 NOTE — Progress Notes (Signed)
 "    06/30/2024 Alice Spencer [id number] [birth year]  Discussed the use of AI scribe software for clinical note transcription with the patient, who gave verbal consent to proceed.  History of Present Illness Alice Spencer is a 59 year old female with gastroesophageal reflux disease and esophageal dysmotility who presents for evaluation of acid reflux and intermittent dysphagia.  Her care is being assigned to Dr. Legrand.  Over the past 6 to 8 months, she has experienced nocturnal acid reflux with burning sensations at night and frequent burping. Triggers include spicy foods, tomato-based products, alcohol, and sodas. Dietary modifications such as avoiding late-night eating, eliminating sodas, and elevating the head of her bed have led to improvement, and she currently does not have severe or nightly acid reflux.  Nexium  20 mg is used intermittently, primarily before meals likely to provoke symptoms. She finds it difficult to maintain a daily medication schedule and does not require acid suppression during the day unless consuming trigger foods. No unintentional weight loss reported.  A prior swallow test (esophagram below) showed slow swallowing. She experiences intermittent episodes of food and liquid feeling transiently stuck, sometimes causing chest discomfort described as feeling like a heart attack. Eating smaller bites, chewing slowly, and taking sips between bites have improved symptoms. She does not have food getting stuck with every meal, and symptoms are intermittent.  She inquires about the effects of GLP-1 agonists on gastric emptying and reflux, as she has considered these medications for weight loss.   Barium esophagram 03/2024:  IMPRESSION: 1. Esophageal dysmotility evidenced by poor primary peristalsis with retropulsion of barium bolus and tertiary contractions.   2.  Small hiatal hernia.   3. Spontaneous, small-volume gastroesophageal reflux. Inducible gastroesophageal reflux  to the level of the mid esophagus.  Colonoscopy at outside facility in 2017 with a 10-year recall recommended.     Past Medical History:  Diagnosis Date   Allergy    Penicillin   Chicken pox    Migraines    PVD (posterior vitreous detachment), right eye 10/29/2016   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 10/31/2016   Retinal hemorrhage of right eye 10/29/2016   Squamous cell cancer of skin of right forearm 08/07/2017   Past Surgical History:  Procedure Laterality Date   BREAST BIOPSY Left 2014   stereo for calcification, Fibroadenoma   BREAST EXCISIONAL BIOPSY Left 09/28/2007   neg   BREAST SURGERY Left 2009   benign phylloid tumor   COLONOSCOPY WITH PROPOFOL  N/A 11/08/2015   Procedure: COLONOSCOPY WITH PROPOFOL ;  Surgeon: Reyes LELON Cota, MD;  Location: [facility];  Service: Endoscopy;  Laterality: N/A;   TONSILLECTOMY  1973    reports that she has never smoked. She has never used smokeless tobacco. She reports current alcohol use of about 2.0 standard drinks of alcohol per week. She reports that she does not use drugs. family history includes Arthritis in her father and maternal grandfather; Asthma in her maternal grandmother; Cancer in her maternal grandfather, maternal uncle, maternal uncle, maternal uncle, paternal aunt, paternal aunt, and paternal grandfather; Cancer (age of onset: 37) in her mother; Diabetes in her father; Hearing loss in her father and sister; Vision loss in her mother. Allergies[1]    Outpatient Encounter Medications as of 06/30/2024  Medication Sig   esomeprazole  (NEXIUM ) 20 MG capsule Take 1 capsule (20 mg total) by mouth daily at 12 noon.   meloxicam  (MOBIC ) 15 MG tablet TAKE 1 TABLET(15 MG) BY MOUTH DAILY  No facility-administered encounter medications on file as of 06/30/2024.    REVIEW OF SYSTEMS  : All other systems reviewed and negative except where noted in the History of Present Illness.   PHYSICAL EXAM: BP (!) 142/80    Pulse 87   Ht 5' 8 (1.727 m)   Wt 207 lb (93.9 kg)   LMP 04/30/2019   SpO2 98%   BMI 31.47 kg/m  General: Well developed white female in no acute distress Head: Normocephalic and atraumatic Eyes:  Sclerae anicteric, conjunctiva pink. Ears: Normal auditory acuity Lungs: Clear throughout to auscultation; no W/R/R. Heart: Regular rate and rhythm; no M/R/G. Musculoskeletal: Symmetrical with no gross deformities  Skin: No lesions on visible extremities Extremities: No edema  Neurological: Alert oriented x 4, grossly non-focal Psychological:  Alert and cooperative. Normal mood and affect  Assessment & Plan Gastroesophageal reflux disease Chronic, mild to moderate nocturnal symptoms improved with lifestyle modifications and intermittent acid suppression. GLP-1 agonist therapy for weight loss may exacerbate symptoms and necessitate more consistent acid suppression. Long-term acid suppressant use carries rare risk of nephrotoxicity and may contribute to osteoporosis, requiring monitoring and supplementation if daily use is necessary. - Prescribed famotidine  20 mg at bedtime for nocturnal symptoms. - Advised continuation of esomeprazole  as needed, particularly with symptom-provoking meals, or consideration of regular daily dosing for optimal prophylaxis. - Discussed long-term safety of acid suppressants, including rare risk of nephrotoxicity and potential contribution to osteoporosis; advised bone density monitoring and vitamin D /calcium supplementation if long-term daily use is required. - Recommended ongoing lifestyle modifications: avoid late meals, elevate head of bed, avoid trigger foods and beverages. - Discussed that GLP-1 agonists may worsen reflux and may require smaller, more frequent meals if initiated. - Advised to notify clinic if symptoms worsen, become nightly, or if further evaluation (e.g., endoscopy) is desired.  Esophageal dysmotility Intermittent symptoms of food and liquid  stasis with esophageal dysmotility seen on swallow study, improved with behavioral modifications. Absence of stricture and intermittent nature support motility disorder rather than fixed obstruction. No specific medical therapy available; endoscopic dilation unlikely to benefit given current findings and symptom pattern, but we did discuss trying dilation to see if it helps. - Advised continuation of behavioral modifications: eat slowly, chew thoroughly, take sips between bites. - Reviewed option of endoscopy with gentle dilation, clarified low likelihood of benefit, and she elected to defer at this time. - Advised to notify clinic if symptoms worsen, become more frequent, or if further evaluation or dilation is desired.   CC:  Marylynn Verneita CROME, MD        [1]  Allergies Allergen Reactions   Penicillins Rash   "

## 2024-07-02 NOTE — Progress Notes (Signed)
 ____________________________________________________________  Attending physician addendum:  Thank you for sending this case to me. I have reviewed the entire note and agree with the plan.  First, to clarify the AI generated  nephrotoxicity risk of PPIs, study show a correlation between renal dysfunction and PPI use but it is not known to be necessarily causative, with other patient related factors confounding that.  Second, hopefully this patient will reconsider an upper endoscopy to rule out a stricture or visible evidence of severe reflux that could be contributing to this dysmotility.  Because if an upper endoscopy is unrevealing, then an esophageal manometry should be considered to see if there is an achalasia variant.  Victory Brand, MD  ____________________________________________________________
